# Patient Record
Sex: Female | Born: 1985 | Race: Black or African American | Hispanic: No | Marital: Single | Smoking: Never smoker
Health system: Southern US, Community
[De-identification: ages and names within clinical notes are randomized; demographics above are authoritative.]

## PROBLEM LIST (undated history)

## (undated) DIAGNOSIS — Z789 Other specified health status: Secondary | ICD-10-CM

## (undated) HISTORY — PX: NO PAST SURGERIES: SHX2092

## (undated) HISTORY — DX: Other specified health status: Z78.9

---

## 2010-02-28 ENCOUNTER — Ambulatory Visit (HOSPITAL_COMMUNITY): Admission: RE | Admit: 2010-02-28 | Discharge: 2010-02-28 | Payer: Self-pay | Admitting: Obstetrics & Gynecology

## 2010-08-01 ENCOUNTER — Ambulatory Visit (HOSPITAL_COMMUNITY): Admission: RE | Admit: 2010-08-01 | Discharge: 2010-08-01 | Payer: Self-pay | Admitting: Obstetrics & Gynecology

## 2010-08-03 ENCOUNTER — Inpatient Hospital Stay (HOSPITAL_COMMUNITY): Admission: AD | Admit: 2010-08-03 | Discharge: 2010-08-05 | Payer: Self-pay | Admitting: Obstetrics & Gynecology

## 2011-01-04 ENCOUNTER — Encounter: Payer: Self-pay | Admitting: Obstetrics & Gynecology

## 2011-02-27 LAB — CBC
HCT: 34.5 % — ABNORMAL LOW (ref 36.0–46.0)
MCH: 35.6 pg — ABNORMAL HIGH (ref 26.0–34.0)
MCHC: 34.2 g/dL (ref 30.0–36.0)
MCHC: 35.2 g/dL (ref 30.0–36.0)
Platelets: 217 10*3/uL (ref 150–400)
Platelets: 244 10*3/uL (ref 150–400)
RBC: 3.42 MIL/uL — ABNORMAL LOW (ref 3.87–5.11)
RDW: 13.4 % (ref 11.5–15.5)
RDW: 13.4 % (ref 11.5–15.5)
WBC: 12.7 10*3/uL — ABNORMAL HIGH (ref 4.0–10.5)

## 2011-04-12 IMAGING — US US OB DETAIL+14 WK
1 series · 14 of 28 positions shown · non-contrast
Comparison: none

OBSTETRICAL ULTRASOUND:
 This ultrasound exam was performed in the [HOSPITAL] Ultrasound Department.  The OB US report was generated in the AS system, and faxed to the ordering physician.  This report is also available in [HOSPITAL]?s Access[employer] and in [REDACTED] PACS.

[Series 1: us ob detail +14 wk · 0.27mm/px · 84 acquisitions, 14 frames shown]
[im 4/84]
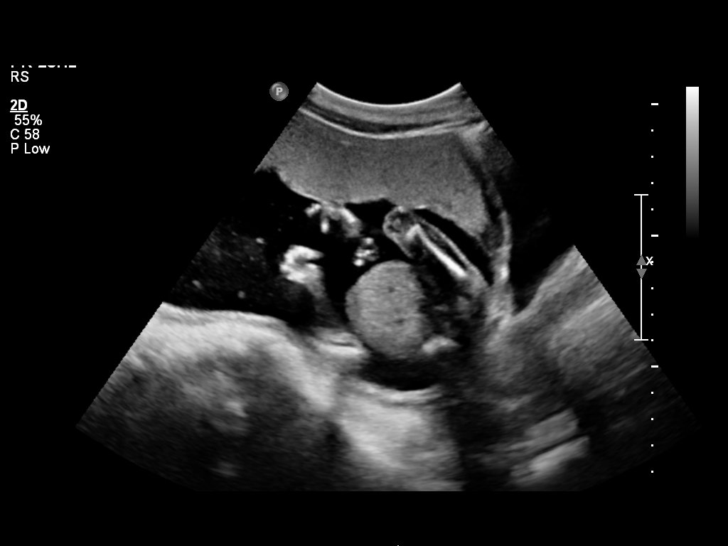
[im 10/84]
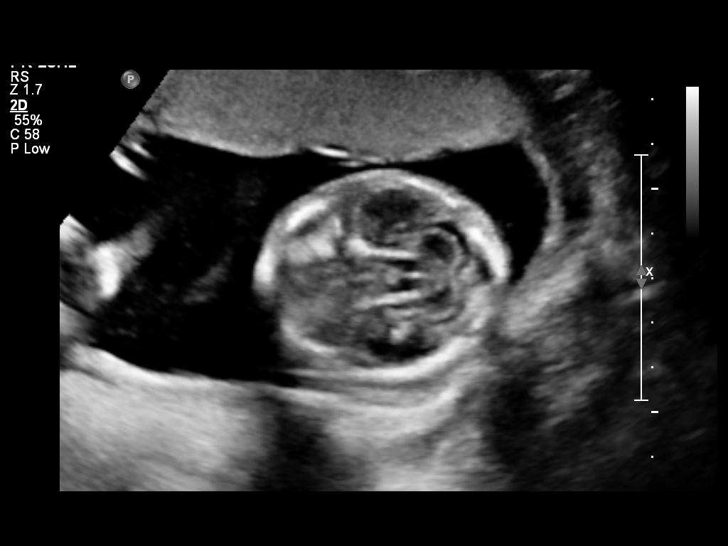
[im 16/84]
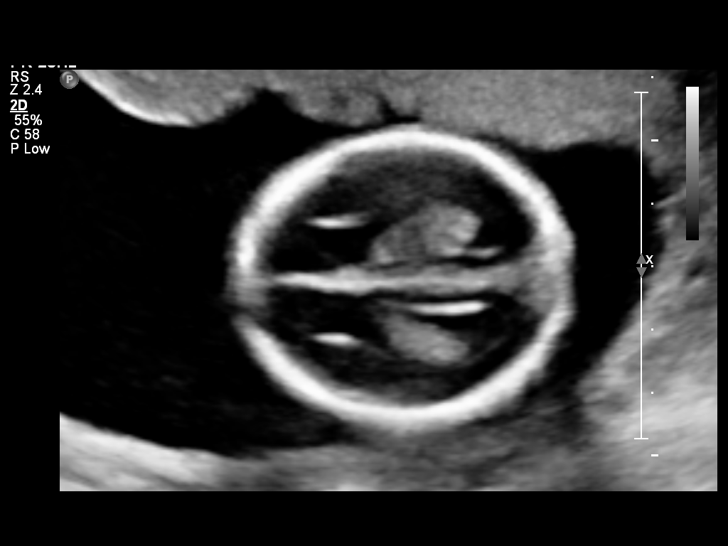
[im 22/84]
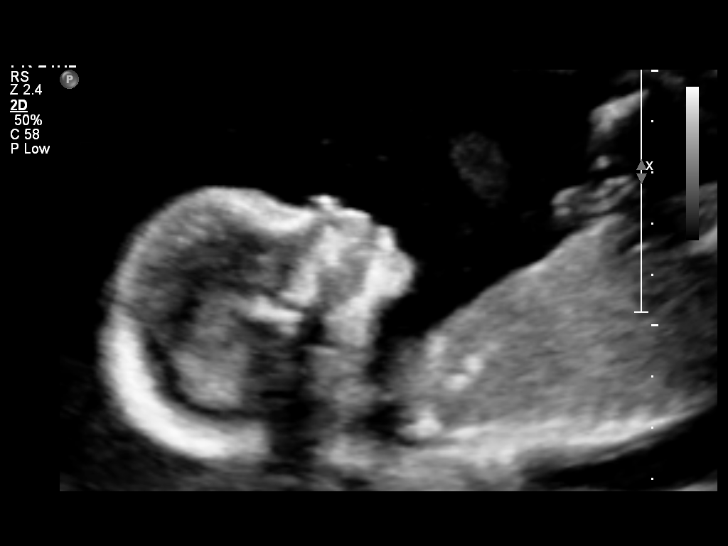
[im 28/84]
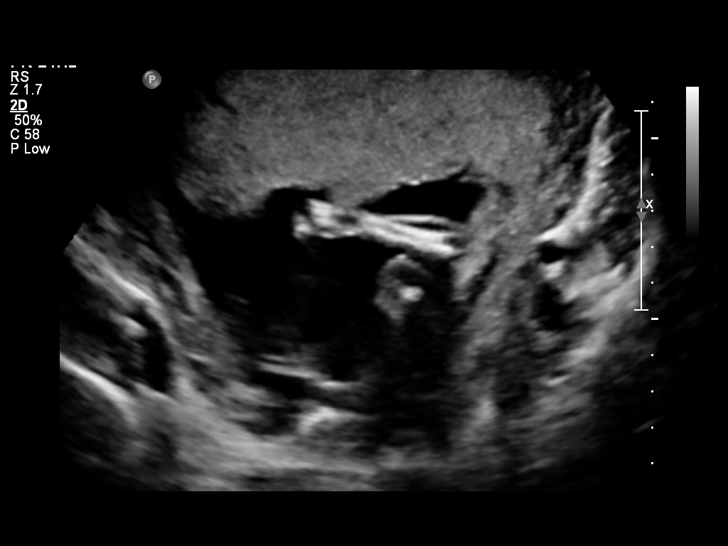
[im 34/84]
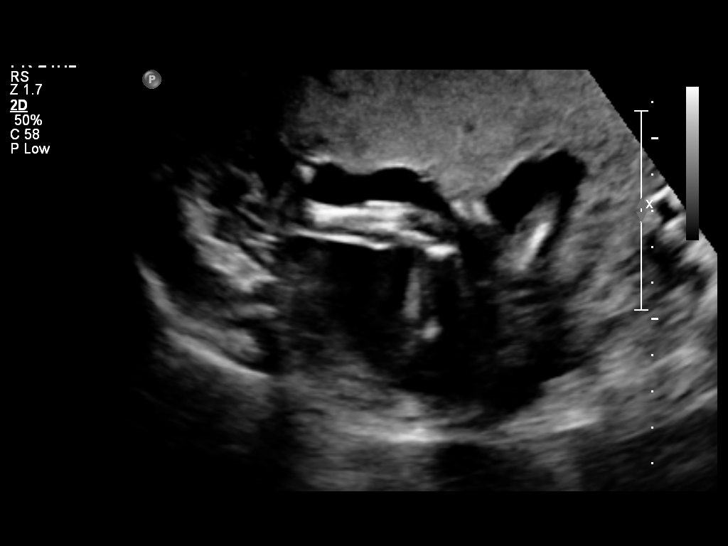
[im 40/84]
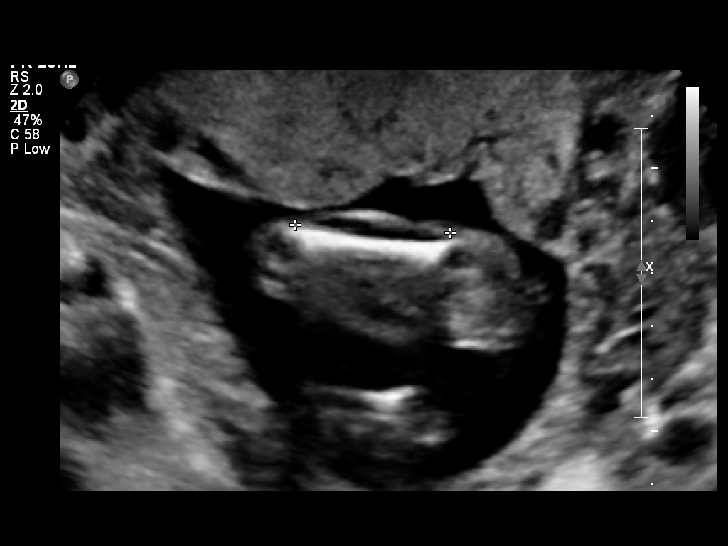
[im 47/84]
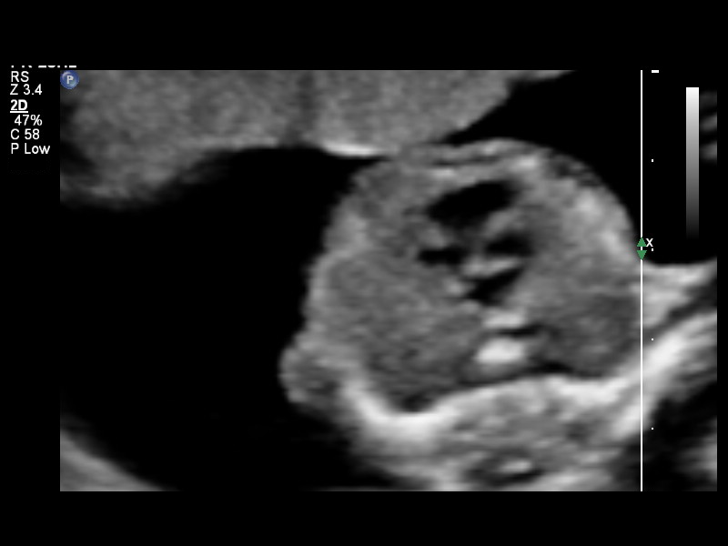
[im 53/84]
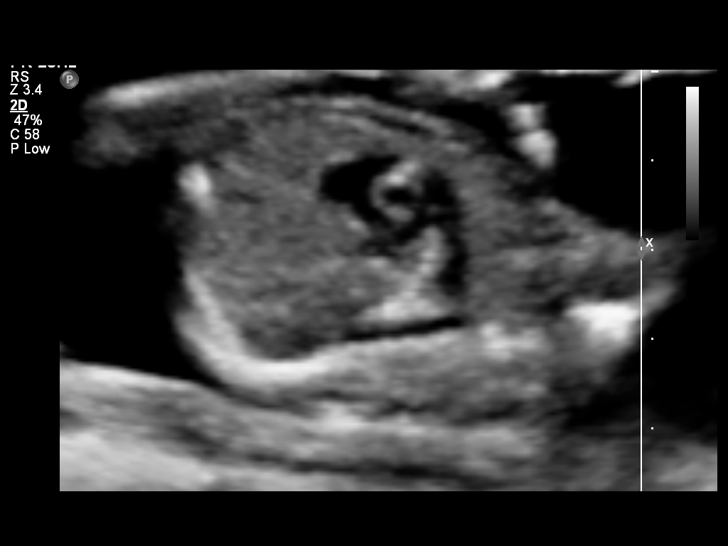
[im 59/84]
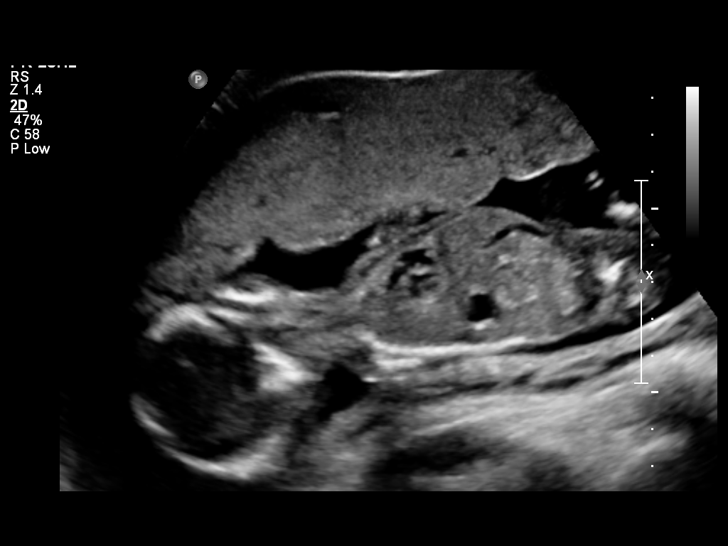
[im 65/84]
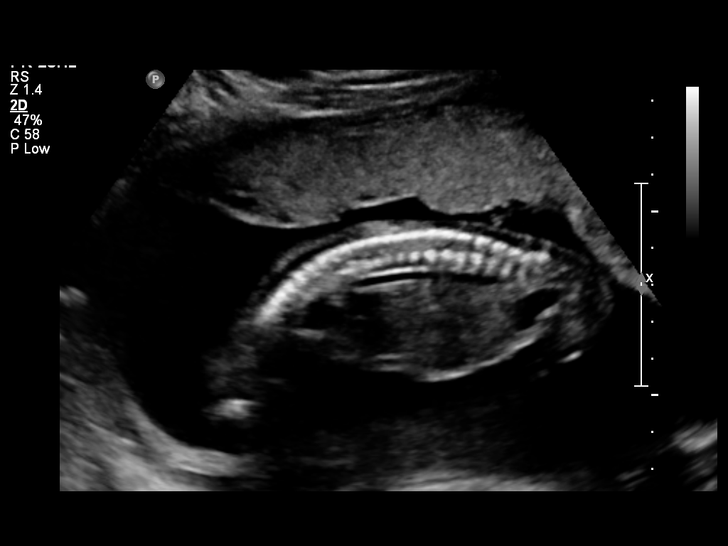
[im 71/84]
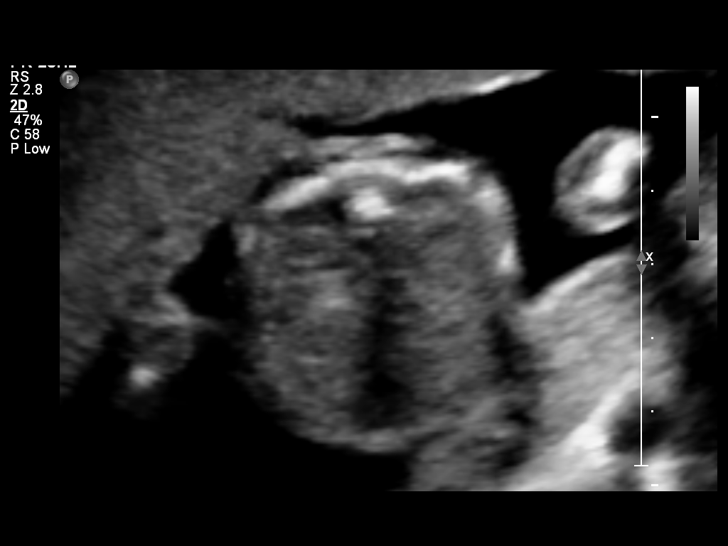
[im 77/84]
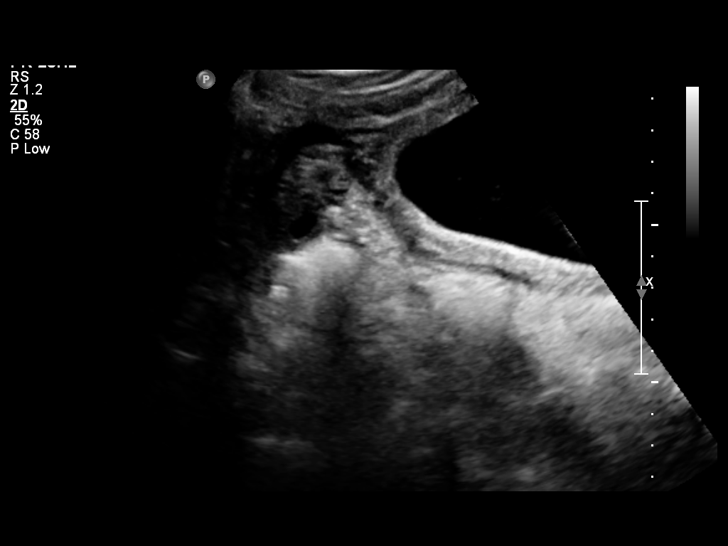
[im 84/84]
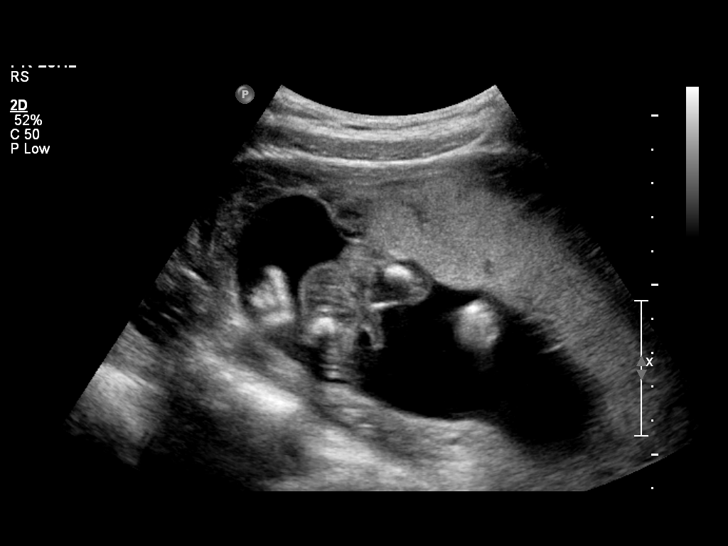

[14 of 28 positions shown; findings below may reference images not displayed]

IMPRESSION: See AS Obstetric US report.

## 2013-10-21 ENCOUNTER — Encounter (HOSPITAL_COMMUNITY): Payer: Self-pay | Admitting: Emergency Medicine

## 2013-10-21 ENCOUNTER — Emergency Department (HOSPITAL_COMMUNITY)
Admission: EM | Admit: 2013-10-21 | Discharge: 2013-10-21 | Disposition: A | Discharge status: Home / Self Care | Payer: Self-pay | Attending: Emergency Medicine | Admitting: Emergency Medicine

## 2013-10-21 DIAGNOSIS — K089 Disorder of teeth and supporting structures, unspecified: Secondary | ICD-10-CM | POA: Insufficient documentation

## 2013-10-21 DIAGNOSIS — K0889 Other specified disorders of teeth and supporting structures: Secondary | ICD-10-CM

## 2013-10-21 MED ORDER — OXYCODONE-ACETAMINOPHEN 5-325 MG PO TABS: 2.0000 | ORAL_TABLET | ORAL | Status: DC | PRN

## 2013-10-21 MED ORDER — OXYCODONE-ACETAMINOPHEN 5-325 MG PO TABS
2.0000 | ORAL_TABLET | Freq: Once | ORAL | Status: AC
  Administered 2013-10-21: 2 via ORAL
  Filled 2013-10-21: qty 2

## 2013-10-21 MED ORDER — PENICILLIN V POTASSIUM 500 MG PO TABS: 500.0000 mg | ORAL_TABLET | Freq: Four times a day (QID) | ORAL | Status: AC

## 2013-10-21 NOTE — ED Provider Notes (Signed)
CSN: [id number]     Arrival date & time 10/21/13  4098 History   This chart was scribed for non-physician practitioner  Irish Elders, FNP, working with No att. providers found, by Yevette Edwards, ED Scribe. This patient was seen in room TR06C/TR06C and the patient's care was started at 9:53 AM.  None    No chief complaint on file.   HPI HPI Comments: Lorraine Olsen is a 27 y.o. female who presents to the Emergency Department complaining of gradually-increasing dental pain to a left-sided lower tooth which began three days ago. The pt has experienced sensitivity to cold and sweet. She has utilized Excedrin to mitigate the pain without relief.  The pt denies difficulty swallowing. She also denies a fever or chills.The pt reports she had issues with the affected tooth as a child, but she denies any recent issues.  She denies any known allergies.   No past medical history on file. No past surgical history on file. No family history on file. History  Substance Use Topics  . Smoking status: Not on file  . Smokeless tobacco: Not on file  . Alcohol Use: Not on file   OB History   No data available     Review of Systems  Constitutional: Negative for fever and chills.  HENT: Positive for dental problem. Negative for trouble swallowing.   All other systems reviewed and are negative.    Allergies  Review of patient's allergies indicates not on file.  Home Medications  No current outpatient prescriptions on file.  Triage Vitals: BP 133/89  Pulse 96  Temp(Src) 97.9 F (36.6 C) (Oral)  Resp 18  SpO2 99%  Physical Exam  Nursing note and vitals reviewed. Constitutional: She appears well-developed and well-nourished. No distress.  HENT:  Head: Normocephalic and atraumatic.  Mouth/Throat: Oropharynx is clear and moist.    Eyes: Pupils are equal, round, and reactive to light.  Neck: Normal range of motion. Neck supple.  Cardiovascular: Normal rate, regular rhythm and normal heart  sounds.   Pulmonary/Chest: Effort normal and breath sounds normal.    ED Course  Procedures (including critical care time)  DIAGNOSTIC STUDIES: Oxygen Saturation is 99% on room air, normal by my interpretation.    COORDINATION OF CARE:  9:55 AM- Discussed treatment plan with patient which includes an antibiotic, and the patient agreed to the plan.   Labs Review Labs Reviewed - No data to display Imaging Review No results found.  EKG Interpretation   None       MDM   1. Toothache     Lower tooth, left side of jaw pain. Tooth is broken, possible root exposure. Erythema and mild gingival edema. Pen VK  , percocet and follow-up with dentist to have tooth extracted. No fever, chills, swelling in neck or difficulty swallowing.   I personally performed the services described in this documentation, which was scribed in my presence. The recorded information has been reviewed and is accurate.      Irish Elders, NP 10/21/13 1109

## 2013-10-21 NOTE — ED Notes (Signed)
PT with dental pain since Wed.  Dental carries noted.

## 2013-10-21 NOTE — ED Provider Notes (Signed)
Medical screening examination/treatment/procedure(s) were performed by non-physician practitioner and as supervising physician I was immediately available for consultation/collaboration.  Matthias Bogus L Makynna Manocchio, MD 10/21/13 1632 

## 2018-04-13 DIAGNOSIS — Z113 Encounter for screening for infections with a predominantly sexual mode of transmission: Secondary | ICD-10-CM | POA: Diagnosis not present

## 2018-04-13 DIAGNOSIS — Z6823 Body mass index (BMI) 23.0-23.9, adult: Secondary | ICD-10-CM | POA: Diagnosis not present

## 2019-08-16 DIAGNOSIS — Z30011 Encounter for initial prescription of contraceptive pills: Secondary | ICD-10-CM | POA: Diagnosis not present

## 2019-08-16 DIAGNOSIS — Z113 Encounter for screening for infections with a predominantly sexual mode of transmission: Secondary | ICD-10-CM | POA: Diagnosis not present

## 2019-08-16 DIAGNOSIS — Z3202 Encounter for pregnancy test, result negative: Secondary | ICD-10-CM | POA: Diagnosis not present

## 2019-08-16 DIAGNOSIS — Z01419 Encounter for gynecological examination (general) (routine) without abnormal findings: Secondary | ICD-10-CM | POA: Diagnosis not present

## 2019-08-16 DIAGNOSIS — N926 Irregular menstruation, unspecified: Secondary | ICD-10-CM | POA: Diagnosis not present

## 2019-12-20 DIAGNOSIS — Z20828 Contact with and (suspected) exposure to other viral communicable diseases: Secondary | ICD-10-CM | POA: Diagnosis not present

## 2019-12-26 DIAGNOSIS — Z03818 Encounter for observation for suspected exposure to other biological agents ruled out: Secondary | ICD-10-CM | POA: Diagnosis not present

## 2020-03-11 DIAGNOSIS — Z3201 Encounter for pregnancy test, result positive: Secondary | ICD-10-CM | POA: Diagnosis not present

## 2020-03-11 DIAGNOSIS — Z3480 Encounter for supervision of other normal pregnancy, unspecified trimester: Secondary | ICD-10-CM | POA: Diagnosis not present

## 2020-03-11 LAB — OB RESULTS CONSOLE GC/CHLAMYDIA
Chlamydia: NEGATIVE
Gonorrhea: NEGATIVE

## 2020-03-11 LAB — OB RESULTS CONSOLE RUBELLA ANTIBODY, IGM: Rubella: IMMUNE

## 2020-03-11 LAB — OB RESULTS CONSOLE HIV ANTIBODY (ROUTINE TESTING): HIV: NONREACTIVE

## 2020-03-11 LAB — OB RESULTS CONSOLE VARICELLA ZOSTER ANTIBODY, IGG: Varicella: IMMUNE

## 2020-03-11 LAB — OB RESULTS CONSOLE GBS: GBS: NEGATIVE

## 2020-03-21 DIAGNOSIS — Z3481 Encounter for supervision of other normal pregnancy, first trimester: Secondary | ICD-10-CM | POA: Diagnosis not present

## 2020-05-01 DIAGNOSIS — Z36 Encounter for antenatal screening for chromosomal anomalies: Secondary | ICD-10-CM | POA: Diagnosis not present

## 2020-09-06 LAB — OB RESULTS CONSOLE GBS: GBS: NEGATIVE

## 2020-10-02 ENCOUNTER — Other Ambulatory Visit: Payer: Self-pay | Admitting: Obstetrics and Gynecology

## 2020-10-02 ENCOUNTER — Encounter (HOSPITAL_COMMUNITY): Payer: Self-pay | Admitting: *Deleted

## 2020-10-02 ENCOUNTER — Other Ambulatory Visit (HOSPITAL_COMMUNITY)
Admission: RE | Admit: 2020-10-02 | Discharge: 2020-10-02 | Disposition: A | Discharge status: Home / Self Care | Payer: 59 | Source: Ambulatory Visit | Attending: Obstetrics and Gynecology | Admitting: Obstetrics and Gynecology

## 2020-10-02 ENCOUNTER — Telehealth (HOSPITAL_COMMUNITY): Payer: Self-pay | Admitting: *Deleted

## 2020-10-02 DIAGNOSIS — Z20822 Contact with and (suspected) exposure to covid-19: Secondary | ICD-10-CM | POA: Diagnosis not present

## 2020-10-02 DIAGNOSIS — Z01812 Encounter for preprocedural laboratory examination: Secondary | ICD-10-CM | POA: Diagnosis not present

## 2020-10-02 LAB — SARS CORONAVIRUS 2 (TAT 6-24 HRS): SARS Coronavirus 2: NEGATIVE

## 2020-10-02 NOTE — H&P (Signed)
Lorraine Olsen is a 34 y.o. female  At 40 weeks and 5 days ( EDD 09/28/2020) presenting for Induction of labor due to post dates. .Pregnancy has been uncomplicated. Prenatal care provided by Dr. Gerald Leitz with [facility].    OB History    Gravida  3   Para  1   Term  1   Preterm      AB  1   Living  1     SAB      TAB  1   Ectopic      Multiple      Live Births  1         08/03/2010 Vaginal delivery at [redacted] wks EGA 8 lbs 9 oz    Past Medical History:  Diagnosis Date  . Medical history non-contributory    Past Surgical History:  Procedure Laterality Date  . NO PAST SURGERIES     Family History: family history includes Hypertension in her father and mother. Social History:  reports that she has never smoked. She has never used smokeless tobacco. She reports that she does not drink alcohol and does not use drugs.     Maternal Diabetes: No Genetic Screening: Declined Maternal Ultrasounds/Referrals: Normal Fetal Ultrasounds or other Referrals:  None Maternal Substance Abuse:  No Significant Maternal Medications:  None Significant Maternal Lab Results:  Group B Strep negative Other Comments:  None  Review of Systems  Constitutional: Negative.   HENT: Negative.   Eyes: Negative.   Respiratory: Negative.   Cardiovascular: Negative.   Gastrointestinal: Negative.   Endocrine: Negative.   Genitourinary: Negative.   Musculoskeletal: Negative.   Skin: Negative.   Allergic/Immunologic: Negative.   Neurological: Negative.   Hematological: Negative.   Psychiatric/Behavioral: Negative.    History   There were no vitals taken for this visit. Maternal Exam:  Introitus: Normal vulva.   Physical Exam Vitals reviewed.  Constitutional:      Appearance: Normal appearance.  HENT:     Head: Normocephalic and atraumatic.     Nose: Nose normal.     Mouth/Throat:     Mouth: Mucous membranes are moist.  Eyes:     Pupils: Pupils are equal, round, and reactive to  light.  Cardiovascular:     Rate and Rhythm: Normal rate and regular rhythm.     Pulses: Normal pulses.     Heart sounds: Normal heart sounds.  Pulmonary:     Effort: Pulmonary effort is normal.     Breath sounds: Normal breath sounds.  Abdominal:     Tenderness: There is no abdominal tenderness.  Genitourinary:    General: Normal vulva.  Musculoskeletal:        General: Swelling present. Normal range of motion.     Cervical back: Normal range of motion and neck supple.  Skin:    General: Skin is warm and dry.  Neurological:     General: No focal deficit present.     Mental Status: She is alert and oriented to person, place, and time.  Psychiatric:        Mood and Affect: Mood normal.        Behavior: Behavior normal.     Prenatal labs: ABO, Rh:  B positive  Antibody:  Screen Negative  Rubella:  Immune  RPR:   Negative  HBsAg:  Negative   HIV:   Negative  GBS:  Negative    Assessment/Plan: 40 wks and 5 days for induction due to postdates.  -  Pitocin for induction  Arom with fetal descent  Anticipate SVD    Gerald Leitz 10/02/2020, 1:41 PM

## 2020-10-02 NOTE — Telephone Encounter (Signed)
Preadmission screen  

## 2020-10-02 NOTE — H&P (Deleted)
  The note originally documented on this encounter has been moved the the encounter in which it belongs.  

## 2020-10-03 ENCOUNTER — Inpatient Hospital Stay (HOSPITAL_COMMUNITY): Payer: 59 | Admitting: Anesthesiology

## 2020-10-03 ENCOUNTER — Other Ambulatory Visit: Payer: Self-pay

## 2020-10-03 ENCOUNTER — Inpatient Hospital Stay (HOSPITAL_COMMUNITY): Payer: 59

## 2020-10-03 ENCOUNTER — Inpatient Hospital Stay (HOSPITAL_COMMUNITY)
Admission: AD | Admit: 2020-10-03 | Discharge: 2020-10-06 | DRG: 788 | Disposition: A | Discharge status: Home / Self Care | Payer: 59 | Attending: Obstetrics and Gynecology | Admitting: Obstetrics and Gynecology

## 2020-10-03 ENCOUNTER — Encounter (HOSPITAL_COMMUNITY): Payer: Self-pay | Admitting: Obstetrics and Gynecology

## 2020-10-03 DIAGNOSIS — O328XX0 Maternal care for other malpresentation of fetus, not applicable or unspecified: Secondary | ICD-10-CM | POA: Diagnosis present

## 2020-10-03 DIAGNOSIS — Z3A40 40 weeks gestation of pregnancy: Secondary | ICD-10-CM | POA: Diagnosis not present

## 2020-10-03 DIAGNOSIS — Z98891 History of uterine scar from previous surgery: Secondary | ICD-10-CM

## 2020-10-03 DIAGNOSIS — O480 Post-term pregnancy: Principal | ICD-10-CM | POA: Diagnosis present

## 2020-10-03 DIAGNOSIS — Z23 Encounter for immunization: Secondary | ICD-10-CM

## 2020-10-03 LAB — CBC
HCT: 34 % — ABNORMAL LOW (ref 36.0–46.0)
Hemoglobin: 11.5 g/dL — ABNORMAL LOW (ref 12.0–15.0)
MCH: 32.4 pg (ref 26.0–34.0)
MCHC: 33.8 g/dL (ref 30.0–36.0)
MCV: 95.8 fL (ref 80.0–100.0)
Platelets: 332 10*3/uL (ref 150–400)
RBC: 3.55 MIL/uL — ABNORMAL LOW (ref 3.87–5.11)
RDW: 15.5 % (ref 11.5–15.5)
WBC: 10.3 10*3/uL (ref 4.0–10.5)
nRBC: 0.2 % (ref 0.0–0.2)

## 2020-10-03 LAB — TYPE AND SCREEN
ABO/RH(D): B POS
Antibody Screen: NEGATIVE

## 2020-10-03 LAB — RPR: RPR Ser Ql: NONREACTIVE

## 2020-10-03 MED ORDER — LACTATED RINGERS IV SOLN: INTRAVENOUS | Status: DC

## 2020-10-03 MED ORDER — OXYTOCIN-SODIUM CHLORIDE 30-0.9 UT/500ML-% IV SOLN
1.0000 m[IU]/min | INTRAVENOUS | Status: DC
  Administered 2020-10-03: 14 m[IU]/min via INTRAVENOUS
  Administered 2020-10-03: 6 m[IU]/min via INTRAVENOUS
  Administered 2020-10-03: 16 m[IU]/min via INTRAVENOUS
  Administered 2020-10-03: 12 m[IU]/min via INTRAVENOUS
  Administered 2020-10-03: 4 m[IU]/min via INTRAVENOUS
  Administered 2020-10-03: 2 m[IU]/min via INTRAVENOUS
  Administered 2020-10-03: 8 m[IU]/min via INTRAVENOUS
  Filled 2020-10-03: qty 500

## 2020-10-03 MED ORDER — TERBUTALINE SULFATE 1 MG/ML IJ SOLN: 0.2500 mg | Freq: Once | INTRAMUSCULAR | Status: DC | PRN

## 2020-10-03 MED ORDER — EPHEDRINE 5 MG/ML INJ: 10.0000 mg | INTRAVENOUS | Status: DC | PRN

## 2020-10-03 MED ORDER — OXYTOCIN-SODIUM CHLORIDE 30-0.9 UT/500ML-% IV SOLN
2.5000 [IU]/h | INTRAVENOUS | Status: DC
  Administered 2020-10-04: 2.5 [IU]/h via INTRAVENOUS

## 2020-10-03 MED ORDER — LIDOCAINE-EPINEPHRINE (PF) 2 %-1:200000 IJ SOLN
INTRAMUSCULAR | Status: DC | PRN
  Administered 2020-10-03: 5 mL via EPIDURAL
  Administered 2020-10-04: 10 mL via EPIDURAL

## 2020-10-03 MED ORDER — LIDOCAINE HCL (PF) 1 % IJ SOLN: 30.0000 mL | INTRAMUSCULAR | Status: DC | PRN

## 2020-10-03 MED ORDER — OXYTOCIN BOLUS FROM INFUSION: 333.0000 mL | Freq: Once | INTRAVENOUS | Status: DC

## 2020-10-03 MED ORDER — SOD CITRATE-CITRIC ACID 500-334 MG/5ML PO SOLN
30.0000 mL | ORAL | Status: DC | PRN
  Administered 2020-10-04: 30 mL via ORAL
  Filled 2020-10-03: qty 15

## 2020-10-03 MED ORDER — OXYCODONE-ACETAMINOPHEN 5-325 MG PO TABS: 1.0000 | ORAL_TABLET | ORAL | Status: DC | PRN

## 2020-10-03 MED ORDER — ACETAMINOPHEN 325 MG PO TABS: 650.0000 mg | ORAL_TABLET | ORAL | Status: DC | PRN

## 2020-10-03 MED ORDER — FENTANYL CITRATE (PF) 2500 MCG/50ML IJ SOLN
INTRAMUSCULAR | Status: DC | PRN
Start: 2020-10-03 — End: 2020-10-04
  Administered 2020-10-03: 12 mL/h via EPIDURAL

## 2020-10-03 MED ORDER — LACTATED RINGERS IV SOLN
500.0000 mL | Freq: Once | INTRAVENOUS | Status: AC
  Administered 2020-10-03: 500 mL via INTRAVENOUS

## 2020-10-03 MED ORDER — PHENYLEPHRINE 40 MCG/ML (10ML) SYRINGE FOR IV PUSH (FOR BLOOD PRESSURE SUPPORT): 80.0000 ug | PREFILLED_SYRINGE | INTRAVENOUS | Status: DC | PRN

## 2020-10-03 MED ORDER — OXYCODONE-ACETAMINOPHEN 5-325 MG PO TABS: 2.0000 | ORAL_TABLET | ORAL | Status: DC | PRN

## 2020-10-03 MED ORDER — FENTANYL CITRATE (PF) 100 MCG/2ML IJ SOLN: 50.0000 ug | INTRAMUSCULAR | Status: DC | PRN

## 2020-10-03 MED ORDER — DIPHENHYDRAMINE HCL 50 MG/ML IJ SOLN: 12.5000 mg | INTRAMUSCULAR | Status: DC | PRN

## 2020-10-03 MED ORDER — FENTANYL-BUPIVACAINE-NACL 0.5-0.125-0.9 MG/250ML-% EP SOLN
12.0000 mL/h | EPIDURAL | Status: DC | PRN
  Filled 2020-10-03: qty 250

## 2020-10-03 MED ORDER — ONDANSETRON HCL 4 MG/2ML IJ SOLN: 4.0000 mg | Freq: Four times a day (QID) | INTRAMUSCULAR | Status: DC | PRN

## 2020-10-03 MED ORDER — LACTATED RINGERS IV SOLN: 500.0000 mL | INTRAVENOUS | Status: DC | PRN

## 2020-10-03 NOTE — Anesthesia Preprocedure Evaluation (Signed)
Anesthesia Evaluation  Patient identified by MRN, date of birth, ID band Patient awake    Reviewed: Allergy & Precautions, NPO status , Patient's Chart, lab work & pertinent test results  Airway Mallampati: II  TM Distance: >3 FB Neck ROM: Full    Dental no notable dental hx.    Pulmonary neg pulmonary ROS,    Pulmonary exam normal breath sounds clear to auscultation       Cardiovascular negative cardio ROS Normal cardiovascular exam Rhythm:Regular Rate:Normal     Neuro/Psych negative neurological ROS  negative psych ROS   GI/Hepatic negative GI ROS, Neg liver ROS,   Endo/Other  negative endocrine ROS  Renal/GU negative Renal ROS  negative genitourinary   Musculoskeletal negative musculoskeletal ROS (+)   Abdominal   Peds  Hematology negative hematology ROS (+)   Anesthesia Other Findings IOL for postdates  Reproductive/Obstetrics (+) Pregnancy                             Anesthesia Physical Anesthesia Plan  ASA: II  Anesthesia Plan: Epidural   Post-op Pain Management:    Induction:   PONV Risk Score and Plan: Treatment may vary due to age or medical condition  Airway Management Planned: Natural Airway  Additional Equipment:   Intra-op Plan:   Post-operative Plan:   Informed Consent: I have reviewed the patients History and Physical, chart, labs and discussed the procedure including the risks, benefits and alternatives for the proposed anesthesia with the patient or authorized representative who has indicated his/her understanding and acceptance.       Plan Discussed with: Anesthesiologist  Anesthesia Plan Comments: (Patient identified. Risks, benefits, options discussed with patient including but not limited to bleeding, infection, nerve damage, paralysis, failed block, incomplete pain control, headache, blood pressure changes, nausea, vomiting, reactions to  medication, itching, and post partum back pain. Confirmed with bedside nurse the patient's most recent platelet count. Confirmed with the patient that they are not taking any anticoagulation, have any bleeding history or any family history of bleeding disorders. Patient expressed understanding and wishes to proceed. All questions were answered. )        Anesthesia Quick Evaluation  

## 2020-10-03 NOTE — Anesthesia Procedure Notes (Signed)
Epidural Patient location during procedure: OB Start time: 10/03/2020 9:29 PM End time: 10/03/2020 9:39 PM  Staffing Anesthesiologist: Elmer Picker, MD Performed: anesthesiologist   Preanesthetic Checklist Completed: patient identified, IV checked, risks and benefits discussed, monitors and equipment checked, pre-op evaluation and timeout performed  Epidural Patient position: sitting Prep: DuraPrep and site prepped and draped Patient monitoring: continuous pulse ox, blood pressure, heart rate and cardiac monitor Approach: midline Location: L3-L4 Injection technique: LOR air  Needle:  Needle type: Tuohy  Needle gauge: 17 G Needle length: 9 cm Needle insertion depth: 6 cm Catheter type: closed end flexible Catheter size: 19 Gauge Catheter at skin depth: 12 cm Test dose: negative  Assessment Sensory level: T8 Events: blood not aspirated, injection not painful, no injection resistance, no paresthesia and negative IV test  Additional Notes Patient identified. Risks/Benefits/Options discussed with patient including but not limited to bleeding, infection, nerve damage, paralysis, failed block, incomplete pain control, headache, blood pressure changes, nausea, vomiting, reactions to medication both or allergic, itching and postpartum back pain. Confirmed with bedside nurse the patient's most recent platelet count. Confirmed with patient that they are not currently taking any anticoagulation, have any bleeding history or any family history of bleeding disorders. Patient expressed understanding and wished to proceed. All questions were answered. Sterile technique was used throughout the entire procedure. Please see nursing notes for vital signs. Test dose was given through epidural catheter and negative prior to continuing to dose epidural or start infusion. Warning signs of high block given to the patient including shortness of breath, tingling/numbness in hands, complete motor block,  or any concerning symptoms with instructions to call for help. Patient was given instructions on fall risk and not to get out of bed. All questions and concerns addressed with instructions to call with any issues or inadequate analgesia.  Reason for block:procedure for pain

## 2020-10-03 NOTE — Progress Notes (Signed)
Lorraine Olsen is a 34 y.o. G3P1011 at [redacted]w[redacted]d  admitted for induction of labor due to Post dates. Due date 09/28/2020.  Subjective: Patient rates contraction 4-5 out of 10 . +FM no lof no vaginal bleeding   Objective: BP 130/80   Pulse 91   Temp 98 F (36.7 C)   Resp 18   Ht 6' (1.829 m)   Wt 114.6 kg   BMI 34.26 kg/m  No intake/output data recorded. No intake/output data recorded.  FHT:  FHR: 135 bpm, variability: moderate,  accelerations:  Present,  decelerations:  Absent UC:   regular, every 2-5 minutes SVE:   4/50/-3 exam by Dr. Richardson Dopp  Labs: Lab Results  Component Value Date   WBC 10.3 10/03/2020   HGB 11.5 (L) 10/03/2020   HCT 34.0 (L) 10/03/2020   MCV 95.8 10/03/2020   PLT 332 10/03/2020    Assessment / Plan: induction of labor due to postdates   Labor: continue pitocin.. plan arom with fetal descent  Preeclampsia:  NA Fetal Wellbeing:  Category I Pain Control:  Labor support without medications I/D:  n/a Anticipated MOD:  NSVD  Dr. Dion Body covering until 7 am 10/04/2020  Gerald Leitz 10/03/2020, 2:55 PM

## 2020-10-03 NOTE — Progress Notes (Signed)
Lorraine Olsen is a 34 y.o. G3P1011 at [redacted]w[redacted]d  admitted for induction of labor due to Post dates. Due date 09/28/2020.  Subjective: Patient rates contraction 4-5 out of 10 . +FM no lof no vaginal bleeding   Objective: BP 135/79   Pulse 87   Temp 98 F (36.7 C)   Resp 18   Ht 6' (1.829 m)   Wt 114.6 kg   BMI 34.26 kg/m  No intake/output data recorded. No intake/output data recorded.  FHT:  120s, accelerations present, no decelerations UC:   regular, every 2-7 minutes Dilation: 3.5 Effacement (%): 50 Cervical Position: Middle Station: -3 Presentation: Vertex Exam by:: Dr. Dion Body   Labs: Lab Results  Component Value Date   WBC 10.3 10/03/2020   HGB 11.5 (L) 10/03/2020   HCT 34.0 (L) 10/03/2020   MCV 95.8 10/03/2020   PLT 332 10/03/2020    Assessment / Plan: induction of labor due to postdates  IUP @ 40 5/7 weeks  Labor: continue pitocin.. plan arom with fetal descent Increase to 30 mUs.  Encouraged pt to walk and sit on the birthing ball. Reassess in a few hours.  If no change, Consider a Pitocin break. Preeclampsia:  NA Fetal Wellbeing:  Category I Pain Control:  Labor support without medications I/D:  n/a Anticipated MOD:  NSVD    Geryl Rankins 10/03/2020, 6:52 PM

## 2020-10-04 ENCOUNTER — Encounter (HOSPITAL_COMMUNITY)
Admission: AD | Disposition: A | Discharge status: Home / Self Care | Payer: Self-pay | Source: Home / Self Care | Attending: Obstetrics and Gynecology

## 2020-10-04 ENCOUNTER — Encounter (HOSPITAL_COMMUNITY): Payer: Self-pay | Admitting: Obstetrics and Gynecology

## 2020-10-04 LAB — CBC
HCT: 28.1 % — ABNORMAL LOW (ref 36.0–46.0)
HCT: 29.4 % — ABNORMAL LOW (ref 36.0–46.0)
Hemoglobin: 9.5 g/dL — ABNORMAL LOW (ref 12.0–15.0)
Hemoglobin: 10 g/dL — ABNORMAL LOW (ref 12.0–15.0)
MCH: 32.8 pg (ref 26.0–34.0)
MCH: 32.8 pg (ref 26.0–34.0)
MCHC: 33.8 g/dL (ref 30.0–36.0)
MCHC: 34 g/dL (ref 30.0–36.0)
MCV: 96.9 fL (ref 80.0–100.0)
MCV: 96.4 fL (ref 80.0–100.0)
Platelets: 294 10*3/uL (ref 150–400)
Platelets: 338 10*3/uL (ref 150–400)
RBC: 2.9 MIL/uL — ABNORMAL LOW (ref 3.87–5.11)
RBC: 3.05 MIL/uL — ABNORMAL LOW (ref 3.87–5.11)
RDW: 15.3 % (ref 11.5–15.5)
RDW: 15.4 % (ref 11.5–15.5)
WBC: 17 10*3/uL — ABNORMAL HIGH (ref 4.0–10.5)
WBC: 17.5 10*3/uL — ABNORMAL HIGH (ref 4.0–10.5)
nRBC: 0 % (ref 0.0–0.2)
nRBC: 0.1 % (ref 0.0–0.2)

## 2020-10-04 SURGERY — Surgical Case
Anesthesia: Epidural | Wound class: Clean Contaminated

## 2020-10-04 MED ORDER — IBUPROFEN 800 MG PO TABS
800.0000 mg | ORAL_TABLET | Freq: Three times a day (TID) | ORAL | Status: DC
  Administered 2020-10-04 – 2020-10-06 (×7): 800 mg via ORAL
  Filled 2020-10-04 (×7): qty 1

## 2020-10-04 MED ORDER — ACETAMINOPHEN 500 MG PO TABS
1000.0000 mg | ORAL_TABLET | Freq: Four times a day (QID) | ORAL | Status: DC
  Administered 2020-10-04 – 2020-10-06 (×7): 1000 mg via ORAL
  Filled 2020-10-04 (×6): qty 2

## 2020-10-04 MED ORDER — NALBUPHINE HCL 10 MG/ML IJ SOLN: 5.0000 mg | INTRAMUSCULAR | Status: DC | PRN

## 2020-10-04 MED ORDER — METHYLERGONOVINE MALEATE 0.2 MG PO TABS: 0.2000 mg | ORAL_TABLET | ORAL | Status: DC | PRN

## 2020-10-04 MED ORDER — CEFAZOLIN SODIUM-DEXTROSE 2-3 GM-%(50ML) IV SOLR
INTRAVENOUS | Status: DC | PRN
  Administered 2020-10-04: 2 g via INTRAVENOUS

## 2020-10-04 MED ORDER — FENTANYL CITRATE (PF) 100 MCG/2ML IJ SOLN: 50.0000 ug | INTRAMUSCULAR | Status: DC | PRN

## 2020-10-04 MED ORDER — SIMETHICONE 80 MG PO CHEW
80.0000 mg | CHEWABLE_TABLET | ORAL | Status: DC
  Administered 2020-10-04 – 2020-10-05 (×3): 80 mg via ORAL
  Filled 2020-10-04 (×2): qty 1

## 2020-10-04 MED ORDER — SIMETHICONE 80 MG PO CHEW
80.0000 mg | CHEWABLE_TABLET | Freq: Three times a day (TID) | ORAL | Status: DC
  Administered 2020-10-04 – 2020-10-06 (×7): 80 mg via ORAL
  Filled 2020-10-04 (×7): qty 1

## 2020-10-04 MED ORDER — SODIUM CHLORIDE 0.9% FLUSH: 3.0000 mL | INTRAVENOUS | Status: DC | PRN

## 2020-10-04 MED ORDER — DEXAMETHASONE SODIUM PHOSPHATE 4 MG/ML IJ SOLN
INTRAMUSCULAR | Status: AC
  Filled 2020-10-04: qty 1

## 2020-10-04 MED ORDER — COCONUT OIL OIL
1.0000 "application " | TOPICAL_OIL | Status: DC | PRN
  Administered 2020-10-05: 1 via TOPICAL

## 2020-10-04 MED ORDER — PRENATAL MULTIVITAMIN CH
1.0000 | ORAL_TABLET | Freq: Every day | ORAL | Status: DC
  Administered 2020-10-04 – 2020-10-06 (×2): 1 via ORAL
  Filled 2020-10-04 (×2): qty 1

## 2020-10-04 MED ORDER — DEXAMETHASONE SODIUM PHOSPHATE 4 MG/ML IJ SOLN
INTRAMUSCULAR | Status: DC | PRN
  Administered 2020-10-04: 10 mg via INTRAVENOUS

## 2020-10-04 MED ORDER — TETANUS-DIPHTH-ACELL PERTUSSIS 5-2.5-18.5 LF-MCG/0.5 IM SUSP: 0.5000 mL | Freq: Once | INTRAMUSCULAR | Status: DC

## 2020-10-04 MED ORDER — ONDANSETRON HCL 4 MG/2ML IJ SOLN
INTRAMUSCULAR | Status: AC
  Filled 2020-10-04: qty 2

## 2020-10-04 MED ORDER — KETOROLAC TROMETHAMINE 30 MG/ML IJ SOLN
30.0000 mg | Freq: Once | INTRAMUSCULAR | Status: AC | PRN
  Administered 2020-10-04: 30 mg via INTRAVENOUS

## 2020-10-04 MED ORDER — MENTHOL 3 MG MT LOZG: 1.0000 | LOZENGE | OROMUCOSAL | Status: DC | PRN

## 2020-10-04 MED ORDER — DIPHENHYDRAMINE HCL 25 MG PO CAPS: 25.0000 mg | ORAL_CAPSULE | Freq: Four times a day (QID) | ORAL | Status: DC | PRN

## 2020-10-04 MED ORDER — PHENYLEPHRINE 40 MCG/ML (10ML) SYRINGE FOR IV PUSH (FOR BLOOD PRESSURE SUPPORT)
PREFILLED_SYRINGE | INTRAVENOUS | Status: AC
  Filled 2020-10-04: qty 10

## 2020-10-04 MED ORDER — ZOLPIDEM TARTRATE 5 MG PO TABS: 5.0000 mg | ORAL_TABLET | Freq: Every evening | ORAL | Status: DC | PRN

## 2020-10-04 MED ORDER — DIBUCAINE (PERIANAL) 1 % EX OINT: 1.0000 "application " | TOPICAL_OINTMENT | CUTANEOUS | Status: DC | PRN

## 2020-10-04 MED ORDER — LIDOCAINE HCL (PF) 2 % IJ SOLN
INTRAMUSCULAR | Status: AC
  Filled 2020-10-04: qty 5

## 2020-10-04 MED ORDER — METHYLERGONOVINE MALEATE 0.2 MG/ML IJ SOLN: 0.2000 mg | INTRAMUSCULAR | Status: DC | PRN

## 2020-10-04 MED ORDER — OXYTOCIN-SODIUM CHLORIDE 30-0.9 UT/500ML-% IV SOLN
2.5000 [IU]/h | INTRAVENOUS | Status: AC
  Administered 2020-10-04: 2.5 [IU]/h via INTRAVENOUS

## 2020-10-04 MED ORDER — OXYTOCIN-SODIUM CHLORIDE 30-0.9 UT/500ML-% IV SOLN
INTRAVENOUS | Status: DC | PRN
  Administered 2020-10-04: 30 [IU] via INTRAVENOUS

## 2020-10-04 MED ORDER — MORPHINE SULFATE (PF) 10 MG/ML IV SOLN
INTRAVENOUS | Status: DC | PRN
  Administered 2020-10-04: 3 mg via EPIDURAL

## 2020-10-04 MED ORDER — PHENYLEPHRINE HCL-NACL 20-0.9 MG/250ML-% IV SOLN
INTRAVENOUS | Status: AC
  Filled 2020-10-04: qty 250

## 2020-10-04 MED ORDER — OXYTOCIN-SODIUM CHLORIDE 30-0.9 UT/500ML-% IV SOLN
INTRAVENOUS | Status: AC
  Filled 2020-10-04: qty 500

## 2020-10-04 MED ORDER — OXYCODONE HCL 5 MG PO TABS: 5.0000 mg | ORAL_TABLET | ORAL | Status: DC | PRN

## 2020-10-04 MED ORDER — ACETAMINOPHEN 10 MG/ML IV SOLN
1000.0000 mg | Freq: Once | INTRAVENOUS | Status: DC | PRN
  Administered 2020-10-04: 1000 mg via INTRAVENOUS

## 2020-10-04 MED ORDER — ONDANSETRON HCL 4 MG/2ML IJ SOLN
INTRAMUSCULAR | Status: DC | PRN
  Administered 2020-10-04: 4 mg via INTRAVENOUS

## 2020-10-04 MED ORDER — INFLUENZA VAC SPLIT QUAD 0.5 ML IM SUSY
0.5000 mL | PREFILLED_SYRINGE | INTRAMUSCULAR | Status: AC
  Administered 2020-10-05: 0.5 mL via INTRAMUSCULAR
  Filled 2020-10-04: qty 0.5

## 2020-10-04 MED ORDER — EPINEPHRINE PF 1 MG/ML IJ SOLN
INTRAMUSCULAR | Status: AC
  Filled 2020-10-04: qty 1

## 2020-10-04 MED ORDER — LACTATED RINGERS IV SOLN: INTRAVENOUS | Status: DC | PRN

## 2020-10-04 MED ORDER — DIPHENHYDRAMINE HCL 25 MG PO CAPS: 25.0000 mg | ORAL_CAPSULE | ORAL | Status: DC | PRN

## 2020-10-04 MED ORDER — NALOXONE HCL 0.4 MG/ML IJ SOLN: 0.4000 mg | INTRAMUSCULAR | Status: DC | PRN

## 2020-10-04 MED ORDER — ACETAMINOPHEN 500 MG PO TABS
1000.0000 mg | ORAL_TABLET | Freq: Four times a day (QID) | ORAL | Status: AC
  Filled 2020-10-04 (×3): qty 2

## 2020-10-04 MED ORDER — ACETAMINOPHEN 10 MG/ML IV SOLN
INTRAVENOUS | Status: AC
  Filled 2020-10-04: qty 100

## 2020-10-04 MED ORDER — KETOROLAC TROMETHAMINE 30 MG/ML IJ SOLN: 30.0000 mg | Freq: Four times a day (QID) | INTRAMUSCULAR | Status: AC | PRN

## 2020-10-04 MED ORDER — MORPHINE SULFATE (PF) 0.5 MG/ML IJ SOLN
INTRAMUSCULAR | Status: AC
  Filled 2020-10-04: qty 10

## 2020-10-04 MED ORDER — KETOROLAC TROMETHAMINE 30 MG/ML IJ SOLN
INTRAMUSCULAR | Status: AC
  Filled 2020-10-04: qty 1

## 2020-10-04 MED ORDER — SENNOSIDES-DOCUSATE SODIUM 8.6-50 MG PO TABS
2.0000 | ORAL_TABLET | ORAL | Status: DC
  Administered 2020-10-04 – 2020-10-05 (×2): 2 via ORAL
  Filled 2020-10-04 (×2): qty 2

## 2020-10-04 MED ORDER — NALBUPHINE HCL 10 MG/ML IJ SOLN: 5.0000 mg | Freq: Once | INTRAMUSCULAR | Status: DC | PRN

## 2020-10-04 MED ORDER — SODIUM CHLORIDE 0.9 % IV SOLN: INTRAVENOUS | Status: DC | PRN

## 2020-10-04 MED ORDER — SODIUM CHLORIDE 0.9 % IR SOLN
Status: DC | PRN
  Administered 2020-10-04 (×2): 1

## 2020-10-04 MED ORDER — CEFAZOLIN SODIUM-DEXTROSE 2-4 GM/100ML-% IV SOLN
INTRAVENOUS | Status: AC
  Filled 2020-10-04: qty 100

## 2020-10-04 MED ORDER — LACTATED RINGERS IV SOLN: INTRAVENOUS | Status: DC

## 2020-10-04 MED ORDER — SIMETHICONE 80 MG PO CHEW: 80.0000 mg | CHEWABLE_TABLET | ORAL | Status: DC | PRN

## 2020-10-04 MED ORDER — NALOXONE HCL 4 MG/10ML IJ SOLN
1.0000 ug/kg/h | INTRAVENOUS | Status: DC | PRN
  Filled 2020-10-04: qty 5

## 2020-10-04 MED ORDER — WITCH HAZEL-GLYCERIN EX PADS: 1.0000 "application " | MEDICATED_PAD | CUTANEOUS | Status: DC | PRN

## 2020-10-04 MED ORDER — SCOPOLAMINE 1 MG/3DAYS TD PT72
MEDICATED_PATCH | TRANSDERMAL | Status: AC
  Filled 2020-10-04: qty 1

## 2020-10-04 MED ORDER — ONDANSETRON HCL 4 MG/2ML IJ SOLN: 4.0000 mg | Freq: Three times a day (TID) | INTRAMUSCULAR | Status: DC | PRN

## 2020-10-04 MED ORDER — DIPHENHYDRAMINE HCL 50 MG/ML IJ SOLN: 12.5000 mg | INTRAMUSCULAR | Status: DC | PRN

## 2020-10-04 MED ORDER — PHENYLEPHRINE 40 MCG/ML (10ML) SYRINGE FOR IV PUSH (FOR BLOOD PRESSURE SUPPORT)
PREFILLED_SYRINGE | INTRAVENOUS | Status: DC | PRN
  Administered 2020-10-04 (×2): 200 ug via INTRAVENOUS
  Administered 2020-10-04: 40 ug via INTRAVENOUS
  Administered 2020-10-04: 80 ug via INTRAVENOUS
  Administered 2020-10-04: 40 ug via INTRAVENOUS
  Administered 2020-10-04: 80 ug via INTRAVENOUS
  Administered 2020-10-04: 120 ug via INTRAVENOUS
  Administered 2020-10-04: 200 ug via INTRAVENOUS

## 2020-10-04 MED ORDER — FENTANYL CITRATE (PF) 100 MCG/2ML IJ SOLN: 25.0000 ug | INTRAMUSCULAR | Status: DC | PRN

## 2020-10-04 MED ORDER — SCOPOLAMINE 1 MG/3DAYS TD PT72
1.0000 | MEDICATED_PATCH | Freq: Once | TRANSDERMAL | Status: DC
  Administered 2020-10-04: 1.5 mg via TRANSDERMAL

## 2020-10-04 MED ORDER — POLYSACCHARIDE IRON COMPLEX 150 MG PO CAPS
150.0000 mg | ORAL_CAPSULE | Freq: Every day | ORAL | Status: DC
  Administered 2020-10-04 – 2020-10-06 (×3): 150 mg via ORAL
  Filled 2020-10-04 (×3): qty 1

## 2020-10-04 SURGICAL SUPPLY — 39 items
BARRIER ADHS 3X4 INTERCEED (GAUZE/BANDAGES/DRESSINGS) ×3 IMPLANT
BENZOIN TINCTURE PRP APPL 2/3 (GAUZE/BANDAGES/DRESSINGS) ×3 IMPLANT
CHLORAPREP W/TINT 26ML (MISCELLANEOUS) ×3 IMPLANT
CLAMP CORD UMBIL (MISCELLANEOUS) IMPLANT
CLOSURE WOUND 1/2 X4 (GAUZE/BANDAGES/DRESSINGS)
CLOSURE WOUND 1/4 X3 (GAUZE/BANDAGES/DRESSINGS) ×1
CLOTH BEACON ORANGE TIMEOUT ST (SAFETY) ×3 IMPLANT
DERMABOND ADVANCED (GAUZE/BANDAGES/DRESSINGS)
DERMABOND ADVANCED .7 DNX12 (GAUZE/BANDAGES/DRESSINGS) IMPLANT
DRSG OPSITE POSTOP 4X10 (GAUZE/BANDAGES/DRESSINGS) ×3 IMPLANT
ELECT REM PT RETURN 9FT ADLT (ELECTROSURGICAL) ×3
ELECTRODE REM PT RTRN 9FT ADLT (ELECTROSURGICAL) ×1 IMPLANT
EXTRACTOR VACUUM BELL STYLE (SUCTIONS) IMPLANT
GLOVE BIOGEL PI IND STRL 7.0 (GLOVE) ×3 IMPLANT
GLOVE BIOGEL PI INDICATOR 7.0 (GLOVE) ×6
GLOVE ECLIPSE 7.0 STRL STRAW (GLOVE) ×3 IMPLANT
GOWN STRL REUS W/TWL LRG LVL3 (GOWN DISPOSABLE) ×6 IMPLANT
KIT ABG SYR 3ML LUER SLIP (SYRINGE) IMPLANT
NEEDLE HYPO 25X5/8 SAFETYGLIDE (NEEDLE) IMPLANT
NS IRRIG 1000ML POUR BTL (IV SOLUTION) ×3 IMPLANT
PACK C SECTION WH (CUSTOM PROCEDURE TRAY) ×3 IMPLANT
PAD ABD 7.5X8 STRL (GAUZE/BANDAGES/DRESSINGS) ×3 IMPLANT
PAD OB MATERNITY 4.3X12.25 (PERSONAL CARE ITEMS) ×3 IMPLANT
PENCIL SMOKE EVAC W/HOLSTER (ELECTROSURGICAL) ×3 IMPLANT
RTRCTR C-SECT PINK 25CM LRG (MISCELLANEOUS) ×3 IMPLANT
SPONGE GAUZE 4X4 12PLY STER LF (GAUZE/BANDAGES/DRESSINGS) ×6 IMPLANT
STRIP CLOSURE SKIN 1/2X4 (GAUZE/BANDAGES/DRESSINGS) IMPLANT
STRIP CLOSURE SKIN 1/4X3 (GAUZE/BANDAGES/DRESSINGS) ×2 IMPLANT
SUT CHROMIC 0 CTX 36 (SUTURE) IMPLANT
SUT MON AB 4-0 PS1 27 (SUTURE) ×3 IMPLANT
SUT PLAIN 0 NONE (SUTURE) IMPLANT
SUT PLAIN 2 0 XLH (SUTURE) ×3 IMPLANT
SUT VIC AB 0 CTX 36 (SUTURE) ×10
SUT VIC AB 0 CTX36XBRD ANBCTRL (SUTURE) ×5 IMPLANT
SUT VIC AB 2-0 CT1 27 (SUTURE) ×2
SUT VIC AB 2-0 CT1 TAPERPNT 27 (SUTURE) ×1 IMPLANT
TOWEL OR 17X24 6PK STRL BLUE (TOWEL DISPOSABLE) ×3 IMPLANT
TRAY FOLEY W/BAG SLVR 14FR LF (SET/KITS/TRAYS/PACK) IMPLANT
WATER STERILE IRR 1000ML POUR (IV SOLUTION) ×3 IMPLANT

## 2020-10-04 NOTE — Brief Op Note (Signed)
10/04/2020  2:03 AM  PATIENT:  Lorraine Olsen  34 y.o. female  PRE-OPERATIVE DIAGNOSIS:  IUP @ 40 5/7 weeks, Homero Fellers breech presentation  POST-OPERATIVE DIAGNOSIS:  Same  PROCEDURE:  Procedure(s): CESAREAN SECTION (N/A), primary LTCS, 2 layer closure  SURGEON:  Surgeon(s) and Role:    Geryl Rankins, MD - Primary  PHYSICIAN ASSISTANT:   ASSISTANTS: Dale [place], CNM   ANESTHESIA:   epidural  EBL:  1800 ml   BLOOD ADMINISTERED:none  DRAINS: Urinary Catheter (Foley)   LOCAL MEDICATIONS USED:  NONE  SPECIMEN:  No Specimen  DISPOSITION OF SPECIMEN:  N/A  COUNTS:  YES  TOURNIQUET:  * No tourniquets in log *  DICTATION: .Other Dictation: Dictation Number [id number]  PLAN OF CARE: Transfer to postpartum after PACU  PATIENT DISPOSITION:  PACU - hemodynamically stable.   Delay start of Pharmacological VTE agent (>24hrs) due to surgical blood loss or risk of bleeding: yes

## 2020-10-04 NOTE — Progress Notes (Signed)
Notified by RN that pt was complete and breech.  Ultrasound confirmed footling breech, head in LUQ. Pt counseled on risks, benefits and alternatives.  I do not perform vaginal breech deliveries.  Pt aware and agreeable to c-section.  Ok with blood transfusion prn.  To OR now.  Ancef 2 grams IV.

## 2020-10-04 NOTE — Anesthesia Postprocedure Evaluation (Signed)
Anesthesia Post Note  Patient: Lorraine Olsen  Procedure(s) Performed: CESAREAN SECTION (N/A )     Patient location during evaluation: Mother Baby Anesthesia Type: Epidural Level of consciousness: awake and alert and oriented Pain management: satisfactory to patient Vital Signs Assessment: post-procedure vital signs reviewed and stable Respiratory status: respiratory function stable and spontaneous breathing Cardiovascular status: blood pressure returned to baseline Postop Assessment: no headache, no backache, spinal receding, patient able to bend at knees and adequate PO intake Anesthetic complications: no   No complications documented.  Last Vitals:  Vitals:   10/04/20 0528 10/04/20 0634  BP: 115/79 126/67  Pulse: (!) 101 87  Resp: 18 18  Temp:  36.6 C  SpO2: 99% 99%    Last Pain:  Vitals:   10/04/20 0634  TempSrc: Oral  PainSc: 0-No pain   Pain Goal:                   Odeal Welden

## 2020-10-04 NOTE — Transfer of Care (Signed)
Immediate Anesthesia Transfer of Care Note  Patient: Lorraine Olsen  Procedure(s) Performed: CESAREAN SECTION (N/A )  Patient Location: PACU  Anesthesia Type:Epidural  Level of Consciousness: awake  Airway & Oxygen Therapy: Patient Spontanous Breathing  Post-op Assessment: Report given to RN and Post -op Vital signs reviewed and stable  Post vital signs: Reviewed and stable  Last Vitals:  Vitals Value Taken Time  BP 136/38 10/04/20 0200  Temp    Pulse 98 10/04/20 0202  Resp 14 10/04/20 0202  SpO2 85 % 10/04/20 0202  Vitals shown include unvalidated device data.  Last Pain:  Vitals:   10/03/20 2211  TempSrc: Oral  PainSc:          Complications: No complications documented.

## 2020-10-04 NOTE — Op Note (Signed)
NAME: Lorraine Olsen, EGGE MEDICAL RECORD [id number] ACCOUNT [id number] DATE OF BIRTH:1986/12/14 FACILITY: [facility] LOCATION: [facility] PHYSICIAN:Bryley Chrisman Derrell Lolling, MD  OPERATIVE REPORT  DATE OF PROCEDURE:  10/04/2020  PREOPERATIVE DIAGNOSES:  Intrauterine pregnancy at 40 and 5/7 weeks, frank breech presentation.  POSTOPERATIVE DIAGNOSES:  Intrauterine pregnancy at 40 and 5/7 weeks, frank breech presentation.  PROCEDURE:  Primary low transverse cesarean section with 2-layer closure.  SURGEON:  Geryl Rankins, MD  ASSISTANT:  Dale Somervell, certified nurse midwife.  ANESTHESIA:  Epidural.  ESTIMATED BLOOD LOSS:  1800.  BLOOD ADMINISTERED:  None.  DRAINS:  Foley catheter.  SPECIMENS:  None.  PATIENT DISPOSITION:  To PACU, hemodynamically stable.  COMPLICATIONS:  None.  FINDINGS:  Viable female infant in the frank breech presentation.  Apgars 8 and 8, weight pending.  Normal uterus, fallopian tubes and ovaries bilaterally.  INDICATIONS:  The patient was admitted for induction of labor due to near postdates.  She ruptured a few hours before the C-section was done, the baby was out of the pelvis for most of the afternoon; however, when she ruptured, she went into labor, got  an epidural but was noted that when she got to 10 cm, the baby was breech and a C-section was called.  Risks, benefits and alternatives were reviewed with the patient.  DESCRIPTION OF PROCEDURE:  The patient was taken to the operating room with IV running.  She was placed in the dorsal supine position with a leftward tilt.  Epidural anesthesia was optimized.  SCDs were on her legs and operating.  She received Ancef 2  grams IV.  Timeout was performed.  Adequate anesthesia was confirmed.  The patient was prepped and draped prior to the timeout and her abdomen was marked for Pfannenstiel skin incision.  Scalpel was used to incise the skin.  Bovie was used to transect through the subcutaneous tissue down to the fascia  which was incised at  the midline and extended laterally with the curved Mayo scissors.  The rectus muscles were dissected sharply off of the fascia with the curved Mayo scissors.  The rectus muscles were already separated at the midline.  Peritoneum was identified, tented up  with the hemostat x2 and then entered bluntly.  Uterus palpated.  The Alexis retractor was then inserted.  The lower uterine segment was identified.  A bladder flap was developed with the Metzenbaum scissors and the Russians.  Transverse incision was  then made on the lower uterine segment and extended with the bandage scissors.  The buttocks was brought to the incision.  I attempted to flex the knee in utero, it was difficult, brought the baby down and I was able to flex the knee to get the feet out one by one.  The baby was rotated to get the arms out and then the head came out  easily.  No nuchal cord noted.  Baby was placed on the field and cleaned up.  There was excessive bleeding noted coming from the uterus, so the baby was attended by the nurse midwife.  The sponges were then used to clear out the clots.  The hysterotomy  edges were clamped with a ring forceps and the placenta was delivered.  The uterus was boggy.  It firmed up with Pitocin and fundal massage.  The uterus was cleared of all trailing membranes with a moist laparotomy sponge x3.  The hysterotomy incision  was then closed with 0 Vicryl in a running locked fashion.  Second layer of the same suture  was used for imbrication and then a few U stitches were performed for hemostasis.  Copious irrigation was performed.  There was a clot that was dislodged from the  left hand side.  Of note, the patient's colon was significantly distended and was out when the baby delivered.  Moistened laparotomy sponge with a tag was placed in the abdomen while the uterus was closed and that was removed prior to irrigation.  Once the uterus was cleared, the gutters were copiously  irrigated and cleaned out with clean laparotomy sponges.  Interceed was applied to the lower uterine segment.  2-0 Vicryl was used to reapproximate the peritoneum in a continuous fashion.  The fascia was closed with 0 Vicryl in a continuous fashion.  The subcutaneous tissue was closed with 2-0 plain gut in a continuous fashion and the skin was reapproximated with  4-0 Monocryl in a subcuticular fashion.  Prior to closure of each layer, hemostasis was achieved underneath and we irrigated at every layer.  All instrument, sponge and needle counts were correct x3.  Baby remained in stable condition in the OR with mom.  Although the patient had a significant blood loss, her vital signs remained stable and she was nice and alert.  The Triton estimated 1800.    My suspicion is that it was more like 1500, but it was a significant blood loss.  CBC will be ordered 4 hours postoperatively.  All instrument, sponge and needle counts were correct x3.  The patient tolerated the procedure well.  HN/NUANCE  D:10/04/2020 T:10/04/2020 JOB:[id number]

## 2020-10-04 NOTE — Lactation Note (Signed)
This note was copied from a baby's chart. Lactation Consultation Note  Patient Name: Lorraine Olsen Lorraine Olsen LEXNT'Z Date: 10/04/2020 Reason for consult: Initial assessment;1st time breastfeeding;Term Baby 10hrs old, mom reports breastfeeding is going ok but with difficulty latching baby to right breast, states baby latches and nurses well from left. Reports last feeding was at 7am. States baby still seems hungry after nursing therefore using a pacifier. Reports having a 32yr old at home who was formula fed. Taught hand expression, 2-3 drops expressed from left breast, LC fed with gloved finger back to baby. Assisted with latching to right breast cross-cradle, baby with difficulty latching, appeared uncomfortable, switched to modified football hold, baby slowly flexes left leg to get into position, latched by  [facility] with compressed breast, breast tissue movement and few audible swallows noted. Mom able to latch deeply, smacking noted ~2-68mins from latch, instructed on how to note when latch becomes shallow and how to reposition.  Discussed cue based feedings, wake if >3hrs since last feeding, 8-12 in 24hrs, expected length of feeding, typical newborn behavior in first 24hrs r/t feedings, hand express after each feeding/ attempt and offer back to baby, optimal skin to skin, avoid pacifier x9mo. Mom voiced understanding and with no further concerns. Left the room with baby still latched ~69min mark. BGilliam, RN, IBCLC  Plan - cue based feedings, wake if >3hrs since last feeding - avoid pacifier x42mo - skin to skin for each feeding - call for [facility] support for new/ worsening feeding concerns - will consider pumping for stimulation if mom with latch difficulty or additional feeding concerns   Maternal Data Formula Feeding for Exclusion: No Has patient been taught Hand Expression?: Yes Does the patient have breastfeeding experience prior to this delivery?: No  Feeding Feeding Type: Breast Fed  LATCH  Score Latch: Repeated attempts needed to sustain latch, nipple held in mouth throughout feeding, stimulation needed to elicit sucking reflex.  Audible Swallowing: A few with stimulation  Type of Nipple: Everted at rest and after stimulation  Comfort (Breast/Nipple): Soft / non-tender  Hold (Positioning): Assistance needed to correctly position infant at breast and maintain latch.  LATCH Score: 7  Interventions Interventions: Breast feeding basics reviewed;Assisted with latch;Skin to skin;Breast massage;Hand express;Breast compression;Adjust position;Support pillows;Position options;Expressed milk  Lactation Tools Discussed/Used     Consult Status Consult Status: Follow-up Date: 10/05/20    Charlynn Court 10/04/2020, 11:46 AM

## 2020-10-04 NOTE — Progress Notes (Signed)
Subjective: Postpartum Day 0: Cesarean Delivery Patient reports tolerating PO and no problems voiding.  Pt reports pain is well controlled    Objective: Vital signs in last 24 hours: Temp:  [97.7 F (36.5 C)-98.4 F (36.9 C)] 97.8 F (36.6 C) (10/22 1415) Pulse Rate:  [80-115] 80 (10/22 1415) Resp:  [14-19] 16 (10/22 1415) BP: (80-139)/(38-107) 104/65 (10/22 1415) SpO2:  [90 %-100 %] 100 % (10/22 1415)  Physical Exam:  General: alert, cooperative and no distress Lochia: appropriate Uterine Fundus: firm Incision: bandage clean dry and intact  DVT Evaluation: No evidence of DVT seen on physical exam.  Recent Labs    10/04/20 0304 10/04/20 0538  HGB 9.5* 10.0*  HCT 28.1* 29.4*    Assessment/Plan: Status post Cesarean section. Doing well postoperatively.  Continue current care Encouraged ambulation  Breastfeeding  Pt desires discharge home on pod #2 if eligible. Gerald Leitz 10/04/2020, 5:41 PM

## 2020-10-05 DIAGNOSIS — Z98891 History of uterine scar from previous surgery: Secondary | ICD-10-CM

## 2020-10-05 NOTE — Lactation Note (Signed)
This note was copied from a baby's chart. Lactation Consultation Note  Patient Name: Lorraine Olsen MBTDH'[id number] Date: 10/05/2020 Reason for consult: Follow-up assessment;1st time breastfeeding;Term Baby is 38hrs old, wt loss 4.13%, mom states still with difficulty latching baby to right breast, baby nurses well on left. Mom reports plans to pump to allow dad to help with feedings. No damage noted to breasts bilat, baby up to right breast in upright position. Mom latched baby without assistance, breast tissue movement, wide angle, audible swallows noted. Baby released breast on own ~72min mark. Baby up for diaper change with dad, moderate amount brown stool noted. Baby noted with thin sublingual frenulum, advised mom baby latches and nurses without difficulty at this time, if with concerns regarding frenulum f/u for further evaluation. Baby up to left breast in cradle hold, LC repositioned baby for more comfort and efficiency at breast, breast tissue movement and audible swallows noted.  Reinforced feed on cue, 8-12 feedings in 24hrs, wake if >3hrs since last feeding, cluster feeding, avoid pacifier use x64mo, [facility] BF brochure with numbers for LC support. Encouraged mom to nurse exclusively at the breast x2weeks, avoid pumping to offer EBM at this time unless indicated. Mom voiced understanding and with no further concerns. Left the room with baby still latched at ~62min mark. BGilliam, RN, IBCLC  Plan - feed on cue, wake if >3hrs since last feeding - skin to skin with feedings - avoid pacifier x2mo  Maternal Data    Feeding Feeding Type: Breast Fed  LATCH Score Latch: Grasps breast easily, tongue down, lips flanged, rhythmical sucking.  Audible Swallowing: Spontaneous and intermittent  Type of Nipple: Everted at rest and after stimulation  Comfort (Breast/Nipple): Soft / non-tender  Hold (Positioning): Assistance needed to correctly position infant at breast and maintain latch.  LATCH  Score: 9  Interventions Interventions: Breast feeding basics reviewed;Assisted with latch;Skin to skin;Breast compression;Adjust position;Position options  Lactation Tools Discussed/Used     Consult Status Consult Status: Follow-up Date: 10/06/20 Follow-up type: In-patient    Charlynn Court 10/05/2020, 3:03 PM

## 2020-10-05 NOTE — Progress Notes (Signed)
Subjective: POD# 1 Live born female  Birth Weight: 8 lb 8.7 oz (3875 g) APGAR: 8, 8  Newborn Delivery   Birth date/time: 10/04/2020 00:48:00 Delivery type: C-Section, Low Transverse Trial of labor: Yes C-section categorization: Primary     Baby name: Journee Delivering provider: Geryl Rankins   Feeding: breast  Pain control at delivery: Epidural   Reports feeling well, desires DC home tomorrow.   Patient reports tolerating PO.   Breast symptoms:none Pain controlled with PO meds Denies HA/SOB/C/P/N/V/dizziness. Flatus present. She reports vaginal bleeding as normal, without clots.  She is ambulating, urinating without difficulty.     Objective:   VS:    Vitals:   10/04/20 1415 10/04/20 1804 10/04/20 2100 10/05/20 0509  BP: 104/65 114/72 110/71 123/82  Pulse: 80 80 85 93  Resp: 16 16 16 18   Temp: 97.8 F (36.6 C) 97.9 F (36.6 C) 98 F (36.7 C) 98.3 F (36.8 C)  TempSrc:  Oral Oral Oral  SpO2: 100% 100% 99% 98%  Weight:      Height:          Intake/Output Summary (Last 24 hours) at 10/05/2020 1316 Last data filed at 10/04/2020 2346 Gross per 24 hour  Intake 460 ml  Output 1700 ml  Net -1240 ml        Recent Labs    10/04/20 0304 10/04/20 0538  WBC 17.0* 17.5*  HGB 9.5* 10.0*  HCT 28.1* 29.4*  PLT 294 338     Blood type: --/--/B POS (10/21 08-[birth year]-1984)  Rubella: Immune (03/29 0000)  Vaccines: TDaP          UTD         Flu             UTD                    COVID-19 desires first dose Pfizer today   Physical Exam:  General: alert, cooperative and no distress CV: Regular rate and rhythm Resp: clear Abdomen: soft, nontender, normal bowel sounds Incision: clean, dry and intact Uterine Fundus: firm, below umbilicus, nontender Lochia: minimal Ext: edema +2 pedal, no cords or tenderness      Assessment/Plan: 34 y.o.   POD# 1. 34                  Principal Problem:   Postpartum care following cesarean delivery 10/22 Active Problems:    Post term pregnancy   Status post primary low transverse cesarean section / BREECH 10/22   Doing well, stable.               Advance diet as tolerated Encourage rest when baby rests Breastfeeding support Encourage to ambulate Routine post-op care Anticipate DC in AM Pfeizer vaccine today.   11/22, CNM, MSN 10/05/2020, 1:16 PM

## 2020-10-06 MED ORDER — OXYCODONE HCL 5 MG PO TABS: 5.0000 mg | ORAL_TABLET | Freq: Four times a day (QID) | ORAL | 0 refills | Status: AC | PRN

## 2020-10-06 MED ORDER — ACETAMINOPHEN 500 MG PO TABS: 1000.0000 mg | ORAL_TABLET | Freq: Four times a day (QID) | ORAL | 0 refills | Status: AC

## 2020-10-06 MED ORDER — IBUPROFEN 800 MG PO TABS: 800.0000 mg | ORAL_TABLET | Freq: Three times a day (TID) | ORAL | 0 refills | Status: AC

## 2020-10-06 NOTE — Plan of Care (Signed)
Discharge teaching given to patient and patient is receptive to all teaching and instructions.

## 2020-10-06 NOTE — Lactation Note (Signed)
This note was copied from a baby's chart. Lactation Consultation Note  Patient Name: Girl Lache Dagher [id number] Date: 10/06/2020 Reason for consult: Follow-up assessment  Follow up with 58 hours old infant with 6.97% weight loss. LC to check infant's latch. Mother states it has been going well and she is already experiencing firmness in her breasts. Discussed engorgement signs and what to expect with milk coming in.    Undressed infant and set up support pillows for football position to right breast. Demonstrated alignment and using pillows for support. Colostrum is easily expressed. Latched infant after a few attempts. Noted suckling and big swallows. Mother denies discomfort or pain. Reinforced keeping infant awake during breastfeeding session by massaging breast, infant's hand/shoulder/feet.   Reviewed hunger and fullness cues, signs of intake, feeding 8-12 times in 24 h period, normal newborn behavior and cluster feeding. Reviewed Lactation Services brochure and other local resources available such as WIC. Praised mother for her efforts and dedication.   Family is ready to be discharged home.   Feeding Feeding Type: Breast Fed  LATCH Score Latch: Grasps breast easily, tongue down, lips flanged, rhythmical sucking.  Audible Swallowing: Spontaneous and intermittent  Type of Nipple: Everted at rest and after stimulation  Comfort (Breast/Nipple): Soft / non-tender  Hold (Positioning): Assistance needed to correctly position infant at breast and maintain latch.  LATCH Score: 9  Interventions Interventions: Breast feeding basics reviewed;Assisted with latch;Hand express;Adjust position;Support pillows;Position options;Expressed milk  Lactation Tools Discussed/Used     Consult Status Consult Status: Complete Date: 10/06/20 Follow-up type: Call as needed    Jobina Maita A Higuera Ancidey 10/06/2020, 11:21 AM

## 2020-10-06 NOTE — Discharge Summary (Signed)
Postpartum Discharge Summary  Date of Service updated 10/06/20     Patient Name: Lorraine Olsen DOB: 1986/07/28 MRN: [id number]  Date of admission: 10/03/2020 Delivery date:10/04/2020  Delivering provider: Thurnell Lose  Date of discharge: 10/06/2020  Admitting diagnosis: Post term pregnancy [O48.0] Intrauterine pregnancy: [redacted]w[redacted]d    Secondary diagnosis:  Principal Problem:   Postpartum care following cesarean delivery 10/22 Active Problems:   Post term pregnancy   Status post primary low transverse cesarean section 10/22  Additional problems: none    Discharge diagnosis: Term Pregnancy Delivered                                              Post partum procedures:none Augmentation: N/A Complications: None  Hospital course: Induction of Labor With Cesarean Section   34y.o. yo [id number] admitted to the hospital 10/03/2020 for induction of labor. Patient had a labor course significant for breech presentation. The patient went for cesarean section due to Malpresentation. Delivery details are as follows: Membrane Rupture Time/Date: 8:42 PM ,10/03/2020   Delivery Method:C-Section, Low Transverse  Details of operation can be found in separate operative Note.  Patient had an uncomplicated postpartum course. She is ambulating, tolerating a regular diet, passing flatus, and urinating well.  Patient is discharged home in stable condition on 10/06/20.      Newborn Data: Birth date:10/04/2020  Birth time:12:48 AM  Gender:Female  Living status:Living  Apgars:8 ,8  Weight:3875 g                                 Magnesium Sulfate received: No BMZ received: No Rhophylac:N/A MMR:N/A Transfusion:No  Physical exam  Vitals:   10/05/20 0509 10/05/20 1645 10/05/20 1945 10/06/20 0507  BP: 123/82 128/81 (!) 143/80 127/82  Pulse: 93 90 92 90  Resp: 18 18 15 16   Temp: 98.3 F (36.8 C) 98.4 F (36.9 C) 98 F (36.7 C) 98.2 F (36.8 C)  TempSrc: Oral Oral Oral Oral  SpO2:  98% 96% 98% 100%  Weight:      Height:       General: alert, cooperative and no distress Lochia: appropriate Uterine Fundus: firm Incision: Dressing is clean, dry, and intact DVT Evaluation: No evidence of DVT seen on physical exam. No cords or calf tenderness. No significant calf/ankle edema. Labs: Lab Results  Component Value Date   WBC 17.5 (H) 10/04/2020   HGB 10.0 (L) 10/04/2020   HCT 29.4 (L) 10/04/2020   MCV 96.4 10/04/2020   PLT 338 10/04/2020   No flowsheet data found. Edinburgh Score: Edinburgh Postnatal Depression Scale Screening Tool 10/04/2020  I have been able to laugh and see the funny side of things. 0  I have looked forward with enjoyment to things. 0  I have blamed myself unnecessarily when things went wrong. 0  I have been anxious or worried for no good reason. 2  I have felt scared or panicky for no good reason. 1  Things have been getting on top of me. 0  I have been so unhappy that I have had difficulty sleeping. 0  I have felt sad or miserable. 0  I have been so unhappy that I have been crying. 0  The thought of harming myself has occurred to me. 0  Edinburgh Postnatal Depression Scale  Total 3      After visit meds:  Allergies as of 10/06/2020   No Known Allergies     Medication List    STOP taking these medications   benzocaine 10 % mucosal gel Commonly known as: ORAJEL     TAKE these medications   acetaminophen 500 MG tablet Commonly known as: TYLENOL Take 2 tablets (1,000 mg total) by mouth every 6 (six) hours.   ibuprofen 800 MG tablet Commonly known as: ADVIL Take 1 tablet (800 mg total) by mouth every 8 (eight) hours.   oxyCODONE 5 MG immediate release tablet Commonly known as: Oxy IR/ROXICODONE Take 1 tablet (5 mg total) by mouth every 6 (six) hours as needed for up to 5 days for moderate pain or severe pain.   prenatal multivitamin Tabs tablet Take 1 tablet by mouth daily at 12 noon.        Discharge home in stable  condition Infant Feeding: Breast Infant Disposition:home with mother Discharge instruction: per After Visit Summary and Postpartum booklet. Activity: Advance as tolerated. Pelvic rest for 6 weeks.  Diet: routine diet Anticipated Birth Control: POPs Postpartum Appointment:6 weeks Additional Postpartum F/U: Incision check 1 week Future Appointments:No future appointments. Follow up Visit:  Follow-up Information    Christophe Louis, MD. Schedule an appointment as soon as possible for a visit in 2 week(s).   Specialty: Obstetrics and Gynecology Why: please make an appt in 2 weeks with Dr. Landry Mellow for an incision check  Contact information: [address] [place] [address] [phone]                   10/06/2020 Arrie Eastern, CNM

## 2020-10-06 NOTE — Discharge Instructions (Signed)

## 2020-10-06 NOTE — Lactation Note (Signed)
This note was copied from a baby's chart. Lactation Consultation Note  Patient Name: Lorraine Olsen [id number] Date: 10/06/2020 Reason for consult: Follow-up assessment  Follow up to 56 hours old infant with 6.97% weight loss. Infant just finished feeding and mother will like to [facility] to observe latch. Infant seems to be cluster-feeding and has been having good output.  RN will contact LC to check latch during next feeding.   Feeding Feeding Type: Breast Fed  LATCH Score Latch: Grasps breast easily, tongue down, lips flanged, rhythmical sucking.  Audible Swallowing: Spontaneous and intermittent  Type of Nipple: Everted at rest and after stimulation  Comfort (Breast/Nipple): Soft / non-tender  Hold (Positioning): Assistance needed to correctly position infant at breast and maintain latch.  LATCH Score: 9  Interventions Interventions: Adjust position;Support pillows  Consult Status Consult Status: Follow-up Date: 10/07/20 Follow-up type: In-patient    Asyia Hornung A Higuera Ancidey 10/06/2020, 9:43 AM

## 2022-09-29 ENCOUNTER — Emergency Department (HOSPITAL_COMMUNITY): Payer: No Typology Code available for payment source

## 2022-09-29 ENCOUNTER — Emergency Department (HOSPITAL_COMMUNITY)
Admission: EM | Admit: 2022-09-29 | Discharge: 2022-09-30 | Disposition: A | Discharge status: Home / Self Care | Payer: No Typology Code available for payment source | Attending: Emergency Medicine | Admitting: Emergency Medicine

## 2022-09-29 ENCOUNTER — Encounter (HOSPITAL_COMMUNITY): Payer: Self-pay

## 2022-09-29 DIAGNOSIS — K219 Gastro-esophageal reflux disease without esophagitis: Secondary | ICD-10-CM

## 2022-09-29 DIAGNOSIS — Z3A01 Less than 8 weeks gestation of pregnancy: Secondary | ICD-10-CM | POA: Diagnosis not present

## 2022-09-29 DIAGNOSIS — O26891 Other specified pregnancy related conditions, first trimester: Secondary | ICD-10-CM | POA: Diagnosis present

## 2022-09-29 LAB — CBC
HCT: 35.5 % — ABNORMAL LOW (ref 36.0–46.0)
Hemoglobin: 12.1 g/dL (ref 12.0–15.0)
MCH: 32 pg (ref 26.0–34.0)
MCHC: 34.1 g/dL (ref 30.0–36.0)
MCV: 93.9 fL (ref 80.0–100.0)
Platelets: 333 10*3/uL (ref 150–400)
RBC: 3.78 MIL/uL — ABNORMAL LOW (ref 3.87–5.11)
RDW: 13.6 % (ref 11.5–15.5)
WBC: 15.1 10*3/uL — ABNORMAL HIGH (ref 4.0–10.5)
nRBC: 0 % (ref 0.0–0.2)

## 2022-09-29 LAB — COMPREHENSIVE METABOLIC PANEL
ALT: 19 U/L (ref 0–44)
AST: 16 U/L (ref 15–41)
Albumin: 3.7 g/dL (ref 3.5–5.0)
Alkaline Phosphatase: 39 U/L (ref 38–126)
Anion gap: 6 (ref 5–15)
BUN: 10 mg/dL (ref 6–20)
CO2: 21 mmol/L — ABNORMAL LOW (ref 22–32)
Calcium: 8.5 mg/dL — ABNORMAL LOW (ref 8.9–10.3)
Chloride: 108 mmol/L (ref 98–111)
Creatinine, Ser: 0.67 mg/dL (ref 0.44–1.00)
GFR, Estimated: >60 mL/min (ref >60)
Glucose, Bld: 114 mg/dL — ABNORMAL HIGH (ref 70–99)
Potassium: 3.6 mmol/L (ref 3.5–5.1)
Sodium: 135 mmol/L (ref 135–145)
Total Bilirubin: 0.6 mg/dL (ref 0.3–1.2)
Total Protein: 7.6 g/dL (ref 6.5–8.1)

## 2022-09-29 LAB — TROPONIN I (HIGH SENSITIVITY): Troponin I (High Sensitivity): <2 ng/L (ref <18)

## 2022-09-29 LAB — HCG, SERUM, QUALITATIVE: Preg, Serum: POSITIVE — AB

## 2022-09-29 MED ORDER — ALUM & MAG HYDROXIDE-SIMETH 200-200-20 MG/5ML PO SUSP
30.0000 mL | Freq: Once | ORAL | Status: AC
  Administered 2022-09-30: 30 mL via ORAL
  Filled 2022-09-29: qty 30

## 2022-09-29 MED ORDER — SUCRALFATE 1 GM/10ML PO SUSP: 1.0000 g | Freq: Three times a day (TID) | ORAL | Status: DC

## 2022-09-29 MED ORDER — ACETAMINOPHEN 500 MG PO TABS
1000.0000 mg | ORAL_TABLET | Freq: Once | ORAL | Status: AC
  Administered 2022-09-30: 1000 mg via ORAL
  Filled 2022-09-29: qty 2

## 2022-09-29 NOTE — ED Triage Notes (Signed)
Pt states that she has been having CP, L sided, pain radiates to neck, nausea, pain comes and goes

## 2022-09-29 NOTE — ED Provider Triage Note (Signed)
Emergency Medicine Provider Triage Evaluation Note  EMRI SAMPLE , a 36 y.o. female  was evaluated in triage.  Pt complains of some abd pain all day and states she developed some chest discomfort approx 1 hour ago. States she took tylenol with some improvement.   States she thinks she may be pregnant. LMP was sept 1st.   Nauseated no vomiting. Nausea primarily in the AM.     Review of Systems  Positive: CP/Abd pain Negative: Fever   Physical Exam  BP 134/79 (BP Location: Left Arm)   Pulse 95   Temp 98.2 F (36.8 C) (Oral)   Resp 18   Ht 6' (1.829 m)   Wt 93 kg   SpO2 100%   BMI 27.80 kg/m  Gen:   Awake, no distress  Resp:  Normal effort  MSK:   Moves extremities without difficulty  Other:  Lungs clear. Well appearing. NAD. Non-toxic. Laughing occasionally.   Medical Decision Making  Medically screening exam initiated at 8:01 PM.  Appropriate orders placed.  Harlene Salts was informed that the remainder of the evaluation will be completed by another provider, this initial triage assessment does not replace that evaluation, and the importance of remaining in the ED until their evaluation is complete.  Labs. Cxr, hcg   Tedd Sias, [place] 09/29/22 2004

## 2022-09-30 DIAGNOSIS — O26891 Other specified pregnancy related conditions, first trimester: Secondary | ICD-10-CM | POA: Diagnosis not present

## 2022-09-30 LAB — TROPONIN I (HIGH SENSITIVITY): Troponin I (High Sensitivity): <2 ng/L (ref <18)

## 2022-09-30 NOTE — ED Provider Notes (Signed)
[facility] DEPT Provider Note   CSN: [id number] Arrival date & time: 09/29/22  1932     History  Chief Complaint  Patient presents with   Chest Pain    Lorraine Olsen is a 36 y.o. female.  The history is provided by the patient.  Chest Pain Chest pain location: LUQ abdomen into the chest, following eating at rest x 24 hours. Pain quality: dull   Pain severity:  Moderate Onset quality:  Gradual Duration:  1 day Timing:  Constant Progression:  Unchanged Chronicity:  New Context: eating and at rest   Relieved by:  Nothing Worsened by:  Nothing Ineffective treatments:  None tried Associated symptoms: no claudication, no fever, no lower extremity edema, no nausea, no orthopnea, no palpitations and no shortness of breath   Risk factors: no aortic disease   Patient with upper abdomen pain into the left chest.  No leg pain or swelling.  No pain with inspiration.  LMP in September.       Home Medications Prior to Admission medications   Medication Sig Start Date End Date Taking? Authorizing Provider  acetaminophen (TYLENOL) 500 MG tablet Take 500 mg by mouth every 6 (six) hours as needed for moderate pain.   Yes [provider]  acetaminophen (TYLENOL) 500 MG tablet Take 2 tablets (1,000 mg total) by mouth every 6 (six) hours. Patient not taking: Reported on 09/29/2022 10/06/20   Arrie Eastern, CNM  ibuprofen (ADVIL) 800 MG tablet Take 1 tablet (800 mg total) by mouth every 8 (eight) hours. Patient not taking: Reported on 09/29/2022 10/06/20   Arrie Eastern, CNM      Allergies    Patient has no known allergies.    Review of Systems   Review of Systems  Constitutional:  Negative for fever.  HENT:  Negative for facial swelling.   Respiratory:  Negative for shortness of breath and stridor.   Cardiovascular:  Positive for chest pain. Negative for palpitations, orthopnea, claudication and leg swelling.  Gastrointestinal:  Negative  for nausea.  All other systems reviewed and are negative.   Physical Exam Updated Vital Signs BP 122/81   Pulse 86   Temp 97.9 F (36.6 C) (Oral)   Resp (!) 21   Ht 6' (1.829 m)   Wt 93 kg   LMP 08/14/2022   SpO2 100%   BMI 27.80 kg/m  Physical Exam Vitals and nursing note reviewed.  Constitutional:      General: She is not in acute distress.    Appearance: Normal appearance. She is well-developed. She is not diaphoretic.  HENT:     Head: Normocephalic and atraumatic.     Nose: Nose normal.  Eyes:     Pupils: Pupils are equal, round, and reactive to light.  Cardiovascular:     Rate and Rhythm: Normal rate and regular rhythm.     Pulses: Normal pulses.     Heart sounds: Normal heart sounds.  Pulmonary:     Effort: Pulmonary effort is normal. No respiratory distress.     Breath sounds: Normal breath sounds.  Abdominal:     General: Abdomen is flat. Bowel sounds are normal. There is no distension.     Palpations: Abdomen is soft.     Tenderness: There is no abdominal tenderness. There is no guarding or rebound.  Genitourinary:    Vagina: No vaginal discharge.  Musculoskeletal:        General: No tenderness. Normal range of motion.  Cervical back: Neck supple.     Comments: Negative Homan sign   Skin:    General: Skin is warm and dry.     Capillary Refill: Capillary refill takes less than 2 seconds.     Findings: No erythema or rash.  Neurological:     General: No focal deficit present.     Mental Status: She is alert and oriented to person, place, and time.     Deep Tendon Reflexes: Reflexes normal.  Psychiatric:        Mood and Affect: Mood normal.     ED Results / Procedures / Treatments   Labs (all labs ordered are listed, but only abnormal results are displayed) Results for orders placed or performed during the hospital encounter of 09/29/22  CBC  Result Value Ref Range   WBC 15.1 (H) 4.0 - 10.5 K/uL   RBC 3.78 (L) 3.87 - 5.11 MIL/uL   Hemoglobin  12.1 12.0 - 15.0 g/dL   HCT 35.5 (L) 36.0 - 46.0 %   MCV 93.9 80.0 - 100.0 fL   MCH 32.0 26.0 - 34.0 pg   MCHC 34.1 30.0 - 36.0 g/dL   RDW 13.6 11.5 - 15.5 %   Platelets 333 150 - 400 K/uL   nRBC 0.0 0.0 - 0.2 %  hCG, serum, qualitative  Result Value Ref Range   Preg, Serum POSITIVE (A) NEGATIVE  Comprehensive metabolic panel  Result Value Ref Range   Sodium 135 135 - 145 mmol/L   Potassium 3.6 3.5 - 5.1 mmol/L   Chloride 108 98 - 111 mmol/L   CO2 21 (L) 22 - 32 mmol/L   Glucose, Bld 114 (H) 70 - 99 mg/dL   BUN 10 6 - 20 mg/dL   Creatinine, Ser 0.67 0.44 - 1.00 mg/dL   Calcium 8.5 (L) 8.9 - 10.3 mg/dL   Total Protein 7.6 6.5 - 8.1 g/dL   Albumin 3.7 3.5 - 5.0 g/dL   AST 16 15 - 41 U/L   ALT 19 0 - 44 U/L   Alkaline Phosphatase 39 38 - 126 U/L   Total Bilirubin 0.6 0.3 - 1.2 mg/dL   GFR, Estimated >60 >60 mL/min   Anion gap 6 5 - 15  Troponin I (High Sensitivity)  Result Value Ref Range   Troponin I (High Sensitivity) <2 <18 ng/L  Troponin I (High Sensitivity)  Result Value Ref Range   Troponin I (High Sensitivity) <2 <18 ng/L   DG Chest 2 View  Result Date: 09/29/2022 CLINICAL DATA:  Chest pain EXAM: CHEST - 2 VIEW COMPARISON:  None Available. FINDINGS: There are minimal strandy opacities in the left lung base. The lungs are otherwise clear. No pleural effusion or pneumothorax. Cardiomediastinal silhouette is within normal limits. No acute fractures are seen. IMPRESSION: Minimal strandy opacities in the left lung base, favored to represent atelectasis. Electronically Signed   By: Ronney Asters M.D.   On: 09/29/2022 20:22     EKG EKG Interpretation  Date/Time:  Tuesday September 29 2022 19:55:36 EDT Ventricular Rate:  88 PR Interval:  187 QRS Duration: 88 QT Interval:  352 QTC Calculation: 426 R Axis:   55 Text Interpretation: Sinus rhythm Consider left atrial enlargement Confirmed by Dory Horn) on 09/29/2022 11:03:52 PM  Radiology DG Chest 2  View  Result Date: 09/29/2022 CLINICAL DATA:  Chest pain EXAM: CHEST - 2 VIEW COMPARISON:  None Available. FINDINGS: There are minimal strandy opacities in the left lung base. The lungs are otherwise  clear. No pleural effusion or pneumothorax. Cardiomediastinal silhouette is within normal limits. No acute fractures are seen. IMPRESSION: Minimal strandy opacities in the left lung base, favored to represent atelectasis. Electronically Signed   By: Ronney Asters M.D.   On: 09/29/2022 20:22    Procedures Procedures    Medications Ordered in ED Medications  sucralfate (CARAFATE) 1 GM/10ML suspension 1 g (has no administration in time range)  alum & mag hydroxide-simeth (MAALOX/MYLANTA) 200-200-20 MG/5ML suspension 30 mL (30 mLs Oral Given 09/30/22 0044)  acetaminophen (TYLENOL) tablet 1,000 mg (1,000 mg Oral Given 09/30/22 0044)    ED Course/ Medical Decision Making/ A&P                           Medical Decision Making 24 hours LUQ pain with radiation to the chest after eating pork chops   Amount and/or Complexity of Data Reviewed External Data Reviewed: notes.    Details: Previous notes reviewed  Labs: ordered. Radiology: ordered.    Details: All labs reviewed: pregnancy test is positive.  2 negative troponins < 2.  LFTs normal.  White count 15.1, hemoglobin normal 12.1, normal platelet count.  Normal sodium 135, normal potassium 3.6, normal creatinine  ECG/medicine tests: ordered and independent interpretation performed. Decision-making details documented in ED Course.  Risk OTC drugs. Prescription drug management. Risk Details: I do not believe this is a PE.  Patient had a normal period in September.  Symptoms started post eating pork chop and red beans and rice.  Patient has ruled out for MI with heart score of 1.  I believe this is gerd.  Maalox for ongoing symptoms and follow up with OB.  Strict return precautions.      Final Clinical Impression(s) / ED Diagnoses Final  diagnoses:  Less than [redacted] weeks gestation of pregnancy   Return for intractable cough, coughing up blood, fevers > 100.4 unrelieved by medication, shortness of breath, intractable vomiting, chest pain, shortness of breath, weakness, numbness, changes in speech, facial asymmetry, abdominal pain, passing out, Inability to tolerate liquids or food, cough, altered mental status or any concerns. No signs of systemic illness or infection. The patient is nontoxic-appearing on exam and vital signs are within normal limits.  I have reviewed the triage vital signs and the nursing notes. Pertinent labs & imaging results that were available during my care of the patient were reviewed by me and considered in my medical decision making (see chart for details). After history, exam, and medical workup I feel the patient has been appropriately medically screened and is safe for discharge home. Pertinent diagnoses were discussed with the patient. Patient was given return precautions.  Rx / DC Orders ED Discharge Orders     None         Kalesha Irving, MD 09/30/22 0236
